# Patient Record
Sex: Male | Born: 1950 | Race: White | Hispanic: No | Marital: Married | Smoking: Never smoker
Health system: Southern US, Community
[De-identification: ages and names within clinical notes are randomized; demographics above are authoritative.]

## PROBLEM LIST (undated history)

## (undated) DIAGNOSIS — I452 Bifascicular block: Secondary | ICD-10-CM

## (undated) DIAGNOSIS — G4730 Sleep apnea, unspecified: Secondary | ICD-10-CM

## (undated) DIAGNOSIS — F329 Major depressive disorder, single episode, unspecified: Secondary | ICD-10-CM

## (undated) DIAGNOSIS — I10 Essential (primary) hypertension: Secondary | ICD-10-CM

## (undated) DIAGNOSIS — F32A Depression, unspecified: Secondary | ICD-10-CM

## (undated) DIAGNOSIS — G894 Chronic pain syndrome: Secondary | ICD-10-CM

## (undated) DIAGNOSIS — E119 Type 2 diabetes mellitus without complications: Secondary | ICD-10-CM

## (undated) DIAGNOSIS — E785 Hyperlipidemia, unspecified: Secondary | ICD-10-CM

## (undated) DIAGNOSIS — R161 Splenomegaly, not elsewhere classified: Secondary | ICD-10-CM

## (undated) DIAGNOSIS — M549 Dorsalgia, unspecified: Secondary | ICD-10-CM

## (undated) HISTORY — DX: Depression, unspecified: F32.A

## (undated) HISTORY — DX: Chronic pain syndrome: G89.4

## (undated) HISTORY — DX: Type 2 diabetes mellitus without complications: E11.9

## (undated) HISTORY — DX: Essential (primary) hypertension: I10

## (undated) HISTORY — DX: Hyperlipidemia, unspecified: E78.5

## (undated) HISTORY — DX: Dorsalgia, unspecified: M54.9

## (undated) HISTORY — DX: Sleep apnea, unspecified: G47.30

## (undated) HISTORY — DX: Bifascicular block: I45.2

## (undated) HISTORY — DX: Splenomegaly, not elsewhere classified: R16.1

## (undated) HISTORY — DX: Major depressive disorder, single episode, unspecified: F32.9

---

## 1975-06-08 HISTORY — PX: INGUINAL HERNIA REPAIR: SHX194

## 1989-06-07 HISTORY — PX: APPENDECTOMY: SHX54

## 1999-06-08 HISTORY — PX: LUMBAR LAMINECTOMY: SHX95

## 1999-11-20 ENCOUNTER — Inpatient Hospital Stay (HOSPITAL_COMMUNITY): Admission: RE | Admit: 1999-11-20 | Discharge: 1999-11-21 | Payer: Self-pay | Admitting: Neurosurgery

## 2000-02-21 ENCOUNTER — Ambulatory Visit (HOSPITAL_COMMUNITY): Admission: RE | Admit: 2000-02-21 | Discharge: 2000-02-21 | Payer: Self-pay | Admitting: Internal Medicine

## 2000-03-11 ENCOUNTER — Encounter: Admission: RE | Admit: 2000-03-11 | Discharge: 2000-04-11 | Payer: Self-pay | Admitting: Neurosurgery

## 2000-04-25 ENCOUNTER — Inpatient Hospital Stay (HOSPITAL_COMMUNITY): Admission: EM | Admit: 2000-04-25 | Discharge: 2000-04-26 | Payer: Self-pay | Admitting: Emergency Medicine

## 2000-04-25 ENCOUNTER — Encounter: Payer: Self-pay | Admitting: *Deleted

## 2000-04-26 ENCOUNTER — Encounter: Payer: Self-pay | Admitting: Cardiovascular Disease

## 2000-10-01 ENCOUNTER — Ambulatory Visit (HOSPITAL_COMMUNITY): Admission: RE | Admit: 2000-10-01 | Discharge: 2000-10-01 | Payer: Self-pay | Admitting: Neurosurgery

## 2001-06-07 DIAGNOSIS — R161 Splenomegaly, not elsewhere classified: Secondary | ICD-10-CM

## 2001-06-07 HISTORY — DX: Splenomegaly, not elsewhere classified: R16.1

## 2002-01-18 ENCOUNTER — Encounter: Payer: Self-pay | Admitting: Internal Medicine

## 2002-01-24 ENCOUNTER — Encounter: Payer: Self-pay | Admitting: Internal Medicine

## 2002-01-25 ENCOUNTER — Encounter: Payer: Self-pay | Admitting: Internal Medicine

## 2003-09-26 ENCOUNTER — Encounter: Payer: Self-pay | Admitting: Internal Medicine

## 2004-02-03 ENCOUNTER — Encounter
Admission: RE | Admit: 2004-02-03 | Discharge: 2004-03-25 | Payer: Self-pay | Admitting: Physical Medicine and Rehabilitation

## 2004-02-28 ENCOUNTER — Ambulatory Visit: Payer: Self-pay | Admitting: Physical Medicine and Rehabilitation

## 2004-03-13 ENCOUNTER — Encounter
Admission: RE | Admit: 2004-03-13 | Discharge: 2004-04-14 | Payer: Self-pay | Admitting: Physical Medicine and Rehabilitation

## 2004-03-25 ENCOUNTER — Encounter
Admission: RE | Admit: 2004-03-25 | Discharge: 2004-05-14 | Payer: Self-pay | Admitting: Physical Medicine and Rehabilitation

## 2004-04-24 ENCOUNTER — Ambulatory Visit: Payer: Self-pay | Admitting: Physical Medicine and Rehabilitation

## 2004-05-14 ENCOUNTER — Encounter
Admission: RE | Admit: 2004-05-14 | Discharge: 2004-08-12 | Payer: Self-pay | Admitting: Physical Medicine and Rehabilitation

## 2004-05-20 ENCOUNTER — Encounter
Admission: RE | Admit: 2004-05-20 | Discharge: 2004-08-18 | Payer: Self-pay | Admitting: Physical Medicine and Rehabilitation

## 2004-06-24 ENCOUNTER — Ambulatory Visit: Payer: Self-pay | Admitting: Physical Medicine and Rehabilitation

## 2004-08-10 ENCOUNTER — Encounter: Admission: RE | Admit: 2004-08-10 | Discharge: 2004-08-10 | Payer: Self-pay | Admitting: Family Medicine

## 2004-08-24 ENCOUNTER — Encounter
Admission: RE | Admit: 2004-08-24 | Discharge: 2004-11-22 | Payer: Self-pay | Admitting: Physical Medicine and Rehabilitation

## 2004-08-26 ENCOUNTER — Ambulatory Visit: Payer: Self-pay | Admitting: Physical Medicine and Rehabilitation

## 2004-09-14 ENCOUNTER — Ambulatory Visit: Payer: Self-pay | Admitting: Internal Medicine

## 2004-10-21 ENCOUNTER — Ambulatory Visit: Payer: Self-pay | Admitting: Physical Medicine and Rehabilitation

## 2004-11-19 ENCOUNTER — Encounter
Admission: RE | Admit: 2004-11-19 | Discharge: 2005-02-17 | Payer: Self-pay | Admitting: Physical Medicine and Rehabilitation

## 2004-12-16 ENCOUNTER — Ambulatory Visit: Payer: Self-pay | Admitting: Physical Medicine and Rehabilitation

## 2004-12-24 ENCOUNTER — Ambulatory Visit: Payer: Self-pay | Admitting: Internal Medicine

## 2005-02-10 ENCOUNTER — Ambulatory Visit: Payer: Self-pay | Admitting: Physical Medicine and Rehabilitation

## 2005-03-09 ENCOUNTER — Encounter
Admission: RE | Admit: 2005-03-09 | Discharge: 2005-06-07 | Payer: Self-pay | Admitting: Physical Medicine and Rehabilitation

## 2005-04-06 ENCOUNTER — Ambulatory Visit: Payer: Self-pay | Admitting: Physical Medicine and Rehabilitation

## 2005-05-28 ENCOUNTER — Ambulatory Visit: Payer: Self-pay | Admitting: Physical Medicine and Rehabilitation

## 2005-06-29 ENCOUNTER — Ambulatory Visit: Payer: Self-pay | Admitting: Physical Medicine and Rehabilitation

## 2005-06-29 ENCOUNTER — Encounter
Admission: RE | Admit: 2005-06-29 | Discharge: 2005-09-27 | Payer: Self-pay | Admitting: Physical Medicine and Rehabilitation

## 2005-07-27 ENCOUNTER — Ambulatory Visit: Payer: Self-pay | Admitting: Physical Medicine and Rehabilitation

## 2005-09-23 ENCOUNTER — Ambulatory Visit: Payer: Self-pay | Admitting: Physical Medicine and Rehabilitation

## 2005-10-21 ENCOUNTER — Encounter
Admission: RE | Admit: 2005-10-21 | Discharge: 2006-01-19 | Payer: Self-pay | Admitting: Physical Medicine and Rehabilitation

## 2005-10-21 ENCOUNTER — Ambulatory Visit: Payer: Self-pay | Admitting: Physical Medicine and Rehabilitation

## 2005-11-18 ENCOUNTER — Ambulatory Visit: Payer: Self-pay | Admitting: Physical Medicine and Rehabilitation

## 2006-01-13 ENCOUNTER — Ambulatory Visit: Payer: Self-pay | Admitting: Physical Medicine and Rehabilitation

## 2006-02-14 ENCOUNTER — Encounter
Admission: RE | Admit: 2006-02-14 | Discharge: 2006-05-15 | Payer: Self-pay | Admitting: Physical Medicine and Rehabilitation

## 2006-02-14 ENCOUNTER — Ambulatory Visit: Payer: Self-pay | Admitting: Physical Medicine and Rehabilitation

## 2006-02-23 ENCOUNTER — Ambulatory Visit: Payer: Self-pay | Admitting: Family Medicine

## 2006-03-07 LAB — HM COLONOSCOPY

## 2006-03-14 ENCOUNTER — Ambulatory Visit: Payer: Self-pay | Admitting: Physical Medicine and Rehabilitation

## 2006-03-16 ENCOUNTER — Ambulatory Visit: Payer: Self-pay | Admitting: Family Medicine

## 2006-05-11 ENCOUNTER — Ambulatory Visit: Payer: Self-pay | Admitting: Physical Medicine and Rehabilitation

## 2006-06-03 ENCOUNTER — Ambulatory Visit: Payer: Self-pay | Admitting: Family Medicine

## 2006-06-08 ENCOUNTER — Encounter
Admission: RE | Admit: 2006-06-08 | Discharge: 2006-09-06 | Payer: Self-pay | Admitting: Physical Medicine and Rehabilitation

## 2006-06-28 ENCOUNTER — Ambulatory Visit: Payer: Self-pay | Admitting: Family Medicine

## 2006-06-28 LAB — CONVERTED CEMR LAB
ALT: 55 units/L — ABNORMAL HIGH (ref 0–40)
Calcium: 9.3 mg/dL (ref 8.4–10.5)
Cholesterol: 183 mg/dL (ref 0–200)
GFR calc non Af Amer: 74 mL/min
Glucose, Bld: 117 mg/dL — ABNORMAL HIGH (ref 70–99)
Potassium: 4.3 meq/L (ref 3.5–5.1)
Sodium: 140 meq/L (ref 135–145)
Total CHOL/HDL Ratio: 3.9
VLDL: 31 mg/dL (ref 0–40)

## 2006-07-05 ENCOUNTER — Ambulatory Visit: Payer: Self-pay | Admitting: Physical Medicine and Rehabilitation

## 2006-07-24 DIAGNOSIS — I10 Essential (primary) hypertension: Secondary | ICD-10-CM

## 2006-07-24 DIAGNOSIS — E785 Hyperlipidemia, unspecified: Secondary | ICD-10-CM

## 2006-07-24 DIAGNOSIS — F329 Major depressive disorder, single episode, unspecified: Secondary | ICD-10-CM

## 2006-07-29 ENCOUNTER — Ambulatory Visit: Payer: Self-pay | Admitting: Family Medicine

## 2006-08-02 ENCOUNTER — Ambulatory Visit: Payer: Self-pay | Admitting: Family Medicine

## 2006-08-02 LAB — CONVERTED CEMR LAB
ALT: 56 units/L — ABNORMAL HIGH (ref 0–40)
AST: 39 units/L — ABNORMAL HIGH (ref 0–37)
BUN: 22 mg/dL (ref 6–23)
CO2: 26 meq/L (ref 19–32)
Potassium: 3.5 meq/L (ref 3.5–5.1)

## 2006-08-30 ENCOUNTER — Ambulatory Visit: Payer: Self-pay | Admitting: Physical Medicine and Rehabilitation

## 2006-09-27 ENCOUNTER — Encounter
Admission: RE | Admit: 2006-09-27 | Discharge: 2006-12-26 | Payer: Self-pay | Admitting: Physical Medicine and Rehabilitation

## 2006-10-21 ENCOUNTER — Ambulatory Visit: Payer: Self-pay | Admitting: Physical Medicine and Rehabilitation

## 2006-10-25 DIAGNOSIS — M549 Dorsalgia, unspecified: Secondary | ICD-10-CM

## 2006-10-25 DIAGNOSIS — G8929 Other chronic pain: Secondary | ICD-10-CM

## 2006-10-25 DIAGNOSIS — G4730 Sleep apnea, unspecified: Secondary | ICD-10-CM | POA: Insufficient documentation

## 2006-11-02 ENCOUNTER — Ambulatory Visit: Payer: Self-pay | Admitting: Family Medicine

## 2006-11-30 ENCOUNTER — Ambulatory Visit: Payer: Self-pay | Admitting: Family Medicine

## 2006-12-04 LAB — CONVERTED CEMR LAB
AST: 37 units/L (ref 0–37)
CO2: 29 meq/L (ref 19–32)
Calcium: 9.3 mg/dL (ref 8.4–10.5)
Chloride: 103 meq/L (ref 96–112)
Cholesterol: 184 mg/dL (ref 0–200)
GFR calc Af Amer: 89 mL/min
GFR calc non Af Amer: 74 mL/min
Glucose, Bld: 128 mg/dL — ABNORMAL HIGH (ref 70–99)
HDL: 45.6 mg/dL (ref >39.0)
Potassium: 4.4 meq/L (ref 3.5–5.1)
Total CHOL/HDL Ratio: 4
Triglycerides: 130 mg/dL (ref 0–149)
VLDL: 26 mg/dL (ref 0–40)

## 2006-12-05 ENCOUNTER — Telehealth (INDEPENDENT_AMBULATORY_CARE_PROVIDER_SITE_OTHER): Payer: Self-pay | Admitting: *Deleted

## 2006-12-16 ENCOUNTER — Telehealth (INDEPENDENT_AMBULATORY_CARE_PROVIDER_SITE_OTHER): Payer: Self-pay | Admitting: *Deleted

## 2006-12-16 ENCOUNTER — Ambulatory Visit: Payer: Self-pay | Admitting: Family Medicine

## 2006-12-16 DIAGNOSIS — E119 Type 2 diabetes mellitus without complications: Secondary | ICD-10-CM

## 2006-12-16 LAB — CONVERTED CEMR LAB: Microalb Creat Ratio: 2.5 mg/g (ref 0.0–30.0)

## 2006-12-19 ENCOUNTER — Ambulatory Visit: Payer: Self-pay | Admitting: Physical Medicine and Rehabilitation

## 2006-12-21 ENCOUNTER — Telehealth (INDEPENDENT_AMBULATORY_CARE_PROVIDER_SITE_OTHER): Payer: Self-pay | Admitting: *Deleted

## 2007-01-02 ENCOUNTER — Encounter: Admission: RE | Admit: 2007-01-02 | Discharge: 2007-03-05 | Payer: Self-pay | Admitting: Family Medicine

## 2007-01-02 ENCOUNTER — Encounter (INDEPENDENT_AMBULATORY_CARE_PROVIDER_SITE_OTHER): Payer: Self-pay | Admitting: Family Medicine

## 2007-01-06 ENCOUNTER — Telehealth (INDEPENDENT_AMBULATORY_CARE_PROVIDER_SITE_OTHER): Payer: Self-pay | Admitting: *Deleted

## 2007-01-10 ENCOUNTER — Telehealth (INDEPENDENT_AMBULATORY_CARE_PROVIDER_SITE_OTHER): Payer: Self-pay | Admitting: *Deleted

## 2007-01-16 ENCOUNTER — Ambulatory Visit: Payer: Self-pay | Admitting: Family Medicine

## 2007-01-18 ENCOUNTER — Encounter
Admission: RE | Admit: 2007-01-18 | Discharge: 2007-04-18 | Payer: Self-pay | Admitting: Physical Medicine and Rehabilitation

## 2007-01-19 ENCOUNTER — Encounter (INDEPENDENT_AMBULATORY_CARE_PROVIDER_SITE_OTHER): Payer: Self-pay | Admitting: *Deleted

## 2007-01-19 LAB — CONVERTED CEMR LAB
ALT: 55 units/L — ABNORMAL HIGH (ref 0–53)
Cholesterol: 167 mg/dL (ref 0–200)
HDL: 43.4 mg/dL (ref >39.0)
Triglycerides: 95 mg/dL (ref 0–149)
VLDL: 19 mg/dL (ref 0–40)

## 2007-02-09 ENCOUNTER — Telehealth (INDEPENDENT_AMBULATORY_CARE_PROVIDER_SITE_OTHER): Payer: Self-pay | Admitting: *Deleted

## 2007-02-14 ENCOUNTER — Ambulatory Visit: Payer: Self-pay | Admitting: Physical Medicine and Rehabilitation

## 2007-03-02 ENCOUNTER — Ambulatory Visit: Payer: Self-pay | Admitting: Family Medicine

## 2007-03-02 ENCOUNTER — Telehealth (INDEPENDENT_AMBULATORY_CARE_PROVIDER_SITE_OTHER): Payer: Self-pay | Admitting: *Deleted

## 2007-03-02 LAB — CONVERTED CEMR LAB
AST: 32 units/L (ref 0–37)
BUN: 18 mg/dL (ref 6–23)
Calcium: 9.8 mg/dL (ref 8.4–10.5)
Chloride: 109 meq/L (ref 96–112)
Cholesterol: 187 mg/dL (ref 0–200)
Creatinine, Ser: 1 mg/dL (ref 0.4–1.5)
Creatinine,U: 174.5 mg/dL
HDL: 45.8 mg/dL (ref >39.0)
Microalb, Ur: 0.9 mg/dL (ref 0.0–1.9)
Potassium: 4.6 meq/L (ref 3.5–5.1)
Total CHOL/HDL Ratio: 4.1

## 2007-04-05 ENCOUNTER — Ambulatory Visit: Payer: Self-pay | Admitting: Physical Medicine and Rehabilitation

## 2007-04-19 ENCOUNTER — Encounter (INDEPENDENT_AMBULATORY_CARE_PROVIDER_SITE_OTHER): Payer: Self-pay | Admitting: Family Medicine

## 2007-05-09 ENCOUNTER — Ambulatory Visit: Payer: Self-pay | Admitting: Physical Medicine and Rehabilitation

## 2007-05-09 ENCOUNTER — Encounter
Admission: RE | Admit: 2007-05-09 | Discharge: 2007-08-07 | Payer: Self-pay | Admitting: Physical Medicine and Rehabilitation

## 2007-05-19 ENCOUNTER — Ambulatory Visit: Payer: Self-pay | Admitting: Family Medicine

## 2007-05-20 ENCOUNTER — Telehealth (INDEPENDENT_AMBULATORY_CARE_PROVIDER_SITE_OTHER): Payer: Self-pay | Admitting: *Deleted

## 2007-05-20 ENCOUNTER — Encounter (INDEPENDENT_AMBULATORY_CARE_PROVIDER_SITE_OTHER): Payer: Self-pay | Admitting: *Deleted

## 2007-05-20 LAB — CONVERTED CEMR LAB
AST: 39 units/L — ABNORMAL HIGH (ref 0–37)
BUN: 15 mg/dL (ref 6–23)
Chloride: 105 meq/L (ref 96–112)
GFR calc Af Amer: 99 mL/min
GFR calc non Af Amer: 82 mL/min
Hgb A1c MFr Bld: 5.6 % (ref 4.6–6.0)
PSA: 0.65 ng/mL (ref 0.10–4.00)
Potassium: 3.5 meq/L (ref 3.5–5.1)
Total CHOL/HDL Ratio: 3.6
Triglycerides: 173 mg/dL — ABNORMAL HIGH (ref 0–149)
VLDL: 35 mg/dL (ref 0–40)

## 2007-06-06 ENCOUNTER — Ambulatory Visit: Payer: Self-pay | Admitting: Physical Medicine and Rehabilitation

## 2007-06-08 DIAGNOSIS — I452 Bifascicular block: Secondary | ICD-10-CM

## 2007-06-08 HISTORY — DX: Bifascicular block: I45.2

## 2007-07-04 ENCOUNTER — Encounter
Admission: RE | Admit: 2007-07-04 | Discharge: 2007-10-02 | Payer: Self-pay | Admitting: Physical Medicine and Rehabilitation

## 2007-07-18 ENCOUNTER — Telehealth (INDEPENDENT_AMBULATORY_CARE_PROVIDER_SITE_OTHER): Payer: Self-pay | Admitting: *Deleted

## 2007-08-01 ENCOUNTER — Ambulatory Visit: Payer: Self-pay | Admitting: Physical Medicine and Rehabilitation

## 2007-08-18 ENCOUNTER — Ambulatory Visit: Payer: Self-pay | Admitting: Family Medicine

## 2007-08-20 LAB — CONVERTED CEMR LAB
ALT: 43 units/L (ref 0–53)
AST: 42 units/L — ABNORMAL HIGH (ref 0–37)
Hgb A1c MFr Bld: 5.7 % (ref 4.6–6.0)

## 2007-08-21 ENCOUNTER — Encounter (INDEPENDENT_AMBULATORY_CARE_PROVIDER_SITE_OTHER): Payer: Self-pay | Admitting: *Deleted

## 2007-09-21 ENCOUNTER — Encounter
Admission: RE | Admit: 2007-09-21 | Discharge: 2007-12-20 | Payer: Self-pay | Admitting: Physical Medicine and Rehabilitation

## 2007-09-25 ENCOUNTER — Ambulatory Visit: Payer: Self-pay | Admitting: Physical Medicine and Rehabilitation

## 2007-10-23 ENCOUNTER — Ambulatory Visit: Payer: Self-pay | Admitting: Physical Medicine and Rehabilitation

## 2007-10-31 ENCOUNTER — Ambulatory Visit: Payer: Self-pay | Admitting: Internal Medicine

## 2007-10-31 ENCOUNTER — Telehealth (INDEPENDENT_AMBULATORY_CARE_PROVIDER_SITE_OTHER): Payer: Self-pay | Admitting: Internal Medicine

## 2007-11-01 ENCOUNTER — Telehealth (INDEPENDENT_AMBULATORY_CARE_PROVIDER_SITE_OTHER): Payer: Self-pay | Admitting: *Deleted

## 2007-11-13 ENCOUNTER — Encounter: Payer: Self-pay | Admitting: Family Medicine

## 2007-11-13 ENCOUNTER — Ambulatory Visit: Payer: Self-pay | Admitting: Internal Medicine

## 2007-11-15 ENCOUNTER — Telehealth (INDEPENDENT_AMBULATORY_CARE_PROVIDER_SITE_OTHER): Payer: Self-pay | Admitting: *Deleted

## 2007-11-16 ENCOUNTER — Telehealth: Payer: Self-pay | Admitting: Internal Medicine

## 2007-11-20 ENCOUNTER — Ambulatory Visit: Payer: Self-pay | Admitting: Physical Medicine and Rehabilitation

## 2007-11-21 ENCOUNTER — Ambulatory Visit: Payer: Self-pay | Admitting: Internal Medicine

## 2007-11-21 DIAGNOSIS — R945 Abnormal results of liver function studies: Secondary | ICD-10-CM

## 2007-11-27 LAB — CONVERTED CEMR LAB
Albumin: 4 g/dL (ref 3.5–5.2)
Basophils Absolute: 0 10*3/uL (ref 0.0–0.1)
Bilirubin, Direct: 0.1 mg/dL (ref 0.0–0.3)
CO2: 30 meq/L (ref 19–32)
Calcium: 9 mg/dL (ref 8.4–10.5)
Creatinine, Ser: 1.1 mg/dL (ref 0.4–1.5)
Eosinophils Absolute: 0.1 10*3/uL (ref 0.0–0.7)
Eosinophils Relative: 2.1 % (ref 0.0–5.0)
GFR calc non Af Amer: 74 mL/min
HCT: 43.4 % (ref 39.0–52.0)
Hemoglobin: 15 g/dL (ref 13.0–17.0)
MCV: 91.5 fL (ref 78.0–100.0)
Monocytes Absolute: 0.4 10*3/uL (ref 0.1–1.0)
Monocytes Relative: 8.8 % (ref 3.0–12.0)
Neutro Abs: 3 10*3/uL (ref 1.4–7.7)
Platelets: 125 10*3/uL — ABNORMAL LOW (ref 150–400)
Potassium: 4.1 meq/L (ref 3.5–5.1)
RBC: 4.74 M/uL (ref 4.22–5.81)
RDW: 11.9 % (ref 11.5–14.6)
Sodium: 142 meq/L (ref 135–145)
Total Protein: 6.4 g/dL (ref 6.0–8.3)
WBC: 4.6 10*3/uL (ref 4.5–10.5)

## 2007-12-06 ENCOUNTER — Ambulatory Visit: Payer: Self-pay | Admitting: Cardiology

## 2007-12-19 ENCOUNTER — Encounter
Admission: RE | Admit: 2007-12-19 | Discharge: 2008-01-26 | Payer: Self-pay | Admitting: Physical Medicine and Rehabilitation

## 2007-12-20 ENCOUNTER — Ambulatory Visit: Payer: Self-pay | Admitting: Physical Medicine and Rehabilitation

## 2007-12-29 ENCOUNTER — Encounter: Payer: Self-pay | Admitting: Cardiology

## 2007-12-29 ENCOUNTER — Ambulatory Visit: Payer: Self-pay

## 2008-01-26 ENCOUNTER — Ambulatory Visit: Payer: Self-pay | Admitting: Physical Medicine and Rehabilitation

## 2008-03-13 ENCOUNTER — Ambulatory Visit: Payer: Self-pay | Admitting: Internal Medicine

## 2008-03-13 DIAGNOSIS — R42 Dizziness and giddiness: Secondary | ICD-10-CM

## 2008-03-22 ENCOUNTER — Encounter
Admission: RE | Admit: 2008-03-22 | Discharge: 2008-06-20 | Payer: Self-pay | Admitting: Physical Medicine and Rehabilitation

## 2008-03-25 ENCOUNTER — Ambulatory Visit: Payer: Self-pay | Admitting: Physical Medicine and Rehabilitation

## 2008-04-22 ENCOUNTER — Encounter: Payer: Self-pay | Admitting: Internal Medicine

## 2008-06-03 ENCOUNTER — Ambulatory Visit: Payer: Self-pay | Admitting: Family Medicine

## 2008-06-03 DIAGNOSIS — M25519 Pain in unspecified shoulder: Secondary | ICD-10-CM | POA: Insufficient documentation

## 2008-06-06 ENCOUNTER — Telehealth (INDEPENDENT_AMBULATORY_CARE_PROVIDER_SITE_OTHER): Payer: Self-pay | Admitting: *Deleted

## 2008-06-11 ENCOUNTER — Telehealth (INDEPENDENT_AMBULATORY_CARE_PROVIDER_SITE_OTHER): Payer: Self-pay | Admitting: *Deleted

## 2008-07-08 HISTORY — PX: SHOULDER SURGERY: SHX246

## 2008-07-18 ENCOUNTER — Ambulatory Visit (HOSPITAL_BASED_OUTPATIENT_CLINIC_OR_DEPARTMENT_OTHER): Admission: RE | Admit: 2008-07-18 | Discharge: 2008-07-19 | Payer: Self-pay | Admitting: Specialist

## 2008-08-06 ENCOUNTER — Encounter
Admission: RE | Admit: 2008-08-06 | Discharge: 2008-11-04 | Payer: Self-pay | Admitting: Physical Medicine and Rehabilitation

## 2008-08-07 ENCOUNTER — Ambulatory Visit: Payer: Self-pay | Admitting: Physical Medicine and Rehabilitation

## 2008-09-28 ENCOUNTER — Emergency Department (HOSPITAL_BASED_OUTPATIENT_CLINIC_OR_DEPARTMENT_OTHER): Admission: EM | Admit: 2008-09-28 | Discharge: 2008-09-28 | Payer: Self-pay | Admitting: Emergency Medicine

## 2008-09-28 ENCOUNTER — Ambulatory Visit: Payer: Self-pay | Admitting: Diagnostic Radiology

## 2008-10-02 ENCOUNTER — Ambulatory Visit: Payer: Self-pay | Admitting: Physical Medicine and Rehabilitation

## 2008-10-21 ENCOUNTER — Ambulatory Visit: Payer: Self-pay | Admitting: Internal Medicine

## 2008-10-28 ENCOUNTER — Ambulatory Visit: Payer: Self-pay | Admitting: Internal Medicine

## 2008-10-30 LAB — CONVERTED CEMR LAB
Albumin: 4.1 g/dL (ref 3.5–5.2)
BUN: 20 mg/dL (ref 6–23)
Basophils Absolute: 0 10*3/uL (ref 0.0–0.1)
Bilirubin, Direct: 0.1 mg/dL (ref 0.0–0.3)
CO2: 29 meq/L (ref 19–32)
Calcium: 9.5 mg/dL (ref 8.4–10.5)
Cholesterol: 178 mg/dL (ref 0–200)
Creatinine, Ser: 1 mg/dL (ref 0.4–1.5)
Creatinine,U: 154.9 mg/dL
GFR calc non Af Amer: 81.69 mL/min (ref >60)
Glucose, Bld: 127 mg/dL — ABNORMAL HIGH (ref 70–99)
HDL: 47.5 mg/dL (ref >39.00)
Hemoglobin: 16.9 g/dL (ref 13.0–17.0)
LDL Cholesterol: 103 mg/dL — ABNORMAL HIGH (ref 0–99)
MCHC: 35.1 g/dL (ref 30.0–36.0)
MCV: 91.9 fL (ref 78.0–100.0)
Microalb Creat Ratio: 1.9 mg/g (ref 0.0–30.0)
Monocytes Absolute: 0.5 10*3/uL (ref 0.1–1.0)
Monocytes Relative: 9.4 % (ref 3.0–12.0)
Platelets: 111 10*3/uL — ABNORMAL LOW (ref 150.0–400.0)
Total CHOL/HDL Ratio: 4
Total Protein: 6.9 g/dL (ref 6.0–8.3)
VLDL: 27.4 mg/dL (ref 0.0–40.0)
WBC: 4.8 10*3/uL (ref 4.5–10.5)

## 2008-11-01 ENCOUNTER — Ambulatory Visit: Payer: Self-pay | Admitting: Physical Medicine and Rehabilitation

## 2008-12-12 ENCOUNTER — Encounter
Admission: RE | Admit: 2008-12-12 | Discharge: 2009-03-12 | Payer: Self-pay | Admitting: Physical Medicine and Rehabilitation

## 2008-12-16 ENCOUNTER — Ambulatory Visit: Payer: Self-pay | Admitting: Physical Medicine and Rehabilitation

## 2009-01-10 ENCOUNTER — Ambulatory Visit: Payer: Self-pay | Admitting: Physical Medicine and Rehabilitation

## 2009-03-06 ENCOUNTER — Encounter
Admission: RE | Admit: 2009-03-06 | Discharge: 2009-06-03 | Payer: Self-pay | Admitting: Physical Medicine & Rehabilitation

## 2009-04-07 ENCOUNTER — Ambulatory Visit: Payer: Self-pay | Admitting: Physical Medicine and Rehabilitation

## 2009-06-16 ENCOUNTER — Ambulatory Visit: Payer: Self-pay | Admitting: Internal Medicine

## 2009-06-18 LAB — CONVERTED CEMR LAB
BUN: 21 mg/dL (ref 6–23)
CO2: 26 meq/L (ref 19–32)
Chloride: 106 meq/L (ref 96–112)
Creatinine, Ser: 1 mg/dL (ref 0.4–1.5)
GFR calc non Af Amer: 81.5 mL/min (ref >60)
Glucose, Bld: 98 mg/dL (ref 70–99)
Hgb A1c MFr Bld: 6 % (ref 4.6–6.5)
Potassium: 3.9 meq/L (ref 3.5–5.1)

## 2009-10-07 ENCOUNTER — Telehealth (INDEPENDENT_AMBULATORY_CARE_PROVIDER_SITE_OTHER): Payer: Self-pay | Admitting: *Deleted

## 2010-04-13 ENCOUNTER — Telehealth (INDEPENDENT_AMBULATORY_CARE_PROVIDER_SITE_OTHER): Payer: Self-pay | Admitting: *Deleted

## 2010-04-27 ENCOUNTER — Ambulatory Visit: Payer: Self-pay | Admitting: Internal Medicine

## 2010-05-08 ENCOUNTER — Ambulatory Visit: Payer: Self-pay | Admitting: Internal Medicine

## 2010-05-11 ENCOUNTER — Ambulatory Visit: Payer: Self-pay | Admitting: Family Medicine

## 2010-05-13 LAB — CONVERTED CEMR LAB
AST: 30 units/L (ref 0–37)
BUN: 16 mg/dL (ref 6–23)
Basophils Absolute: 0 10*3/uL (ref 0.0–0.1)
Basophils Relative: 0.5 % (ref 0.0–3.0)
CO2: 24 meq/L (ref 19–32)
Cholesterol: 188 mg/dL (ref 0–200)
Eosinophils Relative: 2.2 % (ref 0.0–5.0)
GFR calc non Af Amer: 78.53 mL/min (ref >60)
HDL: 47.4 mg/dL (ref >39.00)
Hgb A1c MFr Bld: 5.9 % (ref 4.6–6.5)
Monocytes Absolute: 0.4 10*3/uL (ref 0.1–1.0)
Neutro Abs: 3.1 10*3/uL (ref 1.4–7.7)
RBC: 5.11 M/uL (ref 4.22–5.81)
RDW: 13.1 % (ref 11.5–14.6)

## 2010-05-27 ENCOUNTER — Telehealth: Payer: Self-pay | Admitting: Internal Medicine

## 2010-05-27 ENCOUNTER — Telehealth (INDEPENDENT_AMBULATORY_CARE_PROVIDER_SITE_OTHER): Payer: Self-pay | Admitting: *Deleted

## 2010-06-22 ENCOUNTER — Telehealth: Payer: Self-pay | Admitting: Internal Medicine

## 2010-06-25 ENCOUNTER — Ambulatory Visit
Admission: RE | Admit: 2010-06-25 | Discharge: 2010-06-25 | Payer: Self-pay | Source: Home / Self Care | Attending: Internal Medicine | Admitting: Internal Medicine

## 2010-07-09 ENCOUNTER — Telehealth: Payer: Self-pay | Admitting: Internal Medicine

## 2010-07-09 NOTE — Assessment & Plan Note (Signed)
Summary: cpx///sph   Vital Signs:  Patient profile:   60 year old male Height:      72 inches Weight:      253.50 pounds BMI:     34.51 Pulse rate:   100 / minute Pulse rhythm:   regular BP sitting:   146 / 88  (left arm) Cuff size:   large  Vitals Entered By: Army Fossa CMA (April 27, 2010 3:01 PM) CC: CPX, not fasting  Comments [facility] discuss flu shot    History of Present Illness: CPX   Current Medications (verified): 1)  Zocor 40 Mg  Tabs (Simvastatin) .Marland Kitchen.. 1 By Mouth Once Daily 2)  Benicar Hct 40-25 Mg  Tabs (Olmesartan Medoxomil-Hctz) .... Take One Tablet Daily 3)  Effexor 25 Mg  Tabs (Venlafaxine Hcl) .Marland Kitchen.. 1 By Mouth Two Times A Day 4)  Free Style Lite Test Strips .... Use As Directed Twice Daily  Allergies (verified): 1)  ! Sulfa  Past History:  Past Medical History: DIABETES MELLITUS, TYPE II ---borderline  SLEEP APNEA--couldn't tol CPAP (has a machine) HYPERTENSION  HYPERLIPIDEMIA  DEPRESSION 2003-- was Dx w/  splenomegaly and mild ITP RBBB Dx 2009, neg stress ECHO, saw cards Occ LBP even after surgery in the back 2001  Past Surgical History: Reviewed history from 10/21/2008 and no changes required. Inguinal herniorrhaphy (4332) Appendectomy (9518) Lumbar laminectomy (2001) Dr Lovell Sheehan, still has mild pain R shoulder surgery 07-2008  Family History: Reviewed history from 10/21/2008 and no changes required. 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 CV:  Denies chest pain or discomfort, palpitations, and swelling of feet. Resp:  Denies cough  and shortness of breath. GI:  Denies bloody stools, diarrhea, and nausea. Psych:  Denies anxiety and depression.  Physical Exam  General:  alert, well-developed, and overweight-appearing.   Neck:  no masses and no thyromegaly.   Lungs:  normal respiratory effort, no intercostal retractions, no accessory muscle use, and normal breath sounds.   Heart:  normal rate, regular rhythm, and no murmur.   Abdomen:  soft, non-tender, no distention, no masses, no guarding, and no rigidity.   Rectal:  external  skin tag  noted. Normal sphincter tone. No rectal masses or tenderness. Prostate:  Prostate gland firm and smooth, no enlargement, nodularity, tenderness, mass, asymmetry or induration. Extremities:  no lower extremity edema Neurologic:  alert & oriented X3, strength normal in all extremities, and gait normal.   Psych:  Oriented X3, memory intact for recent and remote, normally interactive, good eye contact, and not depressed appearing.   slightly anxious   Impression & Recommendations:  Problem # 1:  PREVENTIVE HEALTH CARE (ICD-V70.0) Td 2005 flu shot today   3th Cscope  per pt was 2010, next in 5 years   (Dr Dhraelos @ [facility]) labs  long discussion about diet-exercise    Problem # 2:  DIABETES MELLITUS, TYPE II (ICD-250.00) labs  His updated medication list for this problem includes:    Benicar Hct 40-25 Mg Tabs (Olmesartan medoxomil-hctz) .Marland Kitchen... Take one tablet daily  Labs Reviewed: Creat:  1.0 (06/16/2009)    Reviewed HgBA1c results: 6.0 (06/16/2009)  5.6 (10/28/2008)  Problem # 3:  HYPERTENSION (ICD-401.9) labs. see instructions  His updated medication list for this problem includes:    Benicar Hct 40-25 Mg Tabs (Olmesartan medoxomil-hctz) .Marland Kitchen... Take one tablet daily  BP today: 146/88 Prior BP: 120/80 (06/16/2009)  Labs Reviewed: K+: 3.9 (06/16/2009) Creat: : 1.0 (06/16/2009)   Chol: 178 (10/28/2008)   HDL: 47.50 (10/28/2008)   LDL: 103 (10/28/2008)   TG: 137.0  (10/28/2008)  Complete Medication List: 1)  Zocor 40 Mg Tabs (Simvastatin) .Marland Kitchen.. 1 by mouth once daily 2)  Benicar Hct 40-25 Mg Tabs (Olmesartan medoxomil-hctz) .... Take one tablet daily 3)  Effexor 25 Mg Tabs (Venlafaxine hcl) .Marland Kitchen.. 1 by mouth two times a day 4)  Free Style Lite Test Strips  .... Use as directed twice daily  Other Orders: Admin 1st Vaccine (16109) Flu Vaccine 52yrs + (60454)  Patient Instructions: 1)  please come back fasting 2)  CBC, BMP,  AST,  ALT, FLP, PSA--- v70 3)  Hemoglobin A1c, microalbumin--- dx diabetes  4)  Check your blood pressure 2 or 3 times a week. If it is more than 140/85 consistently,please let us know  5)  Please schedule a follow-up appointment in 4 months .  Prescriptions: ZOCOR 40 MG  TABS (SIMVASTATIN) 1 by mouth once daily  #90 x 1   Entered by:   Army Fossa CMA   Authorized by:   Nolon Rod. Paz MD   Signed by:   Army Fossa CMA on 04/27/2010   Method used:   Electronically to        [facility]* (mail-order)       [address] [place], [place]  [address]       Ph: [phone]       Fax: [phone]   RxID:   [id number]  Flu Vaccine Consent Questions     Do you have a history of severe allergic reactions to this vaccine? no    Any prior history of allergic reactions to egg and/or gelatin? no    Do you have a sensitivity to the preservative Thimersol? no    Do you have a past history of Guillan-Barre Syndrome? no    Do you currently have an acute febrile illness? no    Have you ever had a severe reaction to latex? no    Vaccine information given and explained to patient? yes    Are you currently pregnant? no    Lot Number:[id number]   Exp Date:12/05/2010   Site Given  Left Deltoid IM       [address]       [place], [place]  [address]       Ph: [phone]       Fax: [phone]   RxID:   [id number]  .[email or web address]   Orders Added: 1)  Admin 1st Vaccine [90471] 2)  Flu Vaccine 75yrs + [90658] 3)   Est. Patient age 28-64 [phone]

## 2010-07-09 NOTE — Assessment & Plan Note (Signed)
Summary: f/u on back pain/drb   Vital Signs:  Patient profile:   60 year old male Height:      72 inches (182.88 cm) Weight:      256.25 pounds (116.48 kg) BMI:     34.88 Temp:     97.8 degrees F (36.56 degrees C) oral BP sitting:   116 / 74  (left arm)  Vitals Entered By: Lucious Groves CMA (June 25, 2010 2:41 PM) CC: F/U back pain./kb Is Patient Diabetic? Yes Pain Assessment Patient in pain? yes     Location: bacik Intensity: 2 Type: aching Onset of pain  one weekend ago Comments Patient notes that he did strenuous work over the holidays and hurt himself and did so again one weekend ago.    History of Present Illness: he was seen 05/11/10 with severe exacerbation of back pain Symptoms were severe for 3 days then got  better. He reinjured his back about 10 days ago while working in the yard and later  fixing a toilet in his house. This pain is similar to his previous back exacerbations.  ROS No fevers No bladder or bowel incontinence no rash in the back  Current Medications (verified): 1)  Benicar Hct 40-25 Mg  Tabs (Olmesartan Medoxomil-Hctz) .... Take One Tablet Daily 2)  Effexor 25 Mg  Tabs (Venlafaxine Hcl) .Marland Kitchen.. 1 By Mouth Two Times A Day 3)  Free Style Lite Test Strips .... Use As Directed Twice Daily 4)  Flexeril 10 Mg Tabs (Cyclobenzaprine Hcl) .Marland Kitchen.. 1 By Mouth Three Times A Day As Needed 5)  Vicodin Es 7.5-750 Mg Tabs (Hydrocodone-Acetaminophen) .Marland Kitchen.. 1 By Mouth Every 6 Hours As Needed 6)  Lipitor 20 Mg Tabs (Atorvastatin Calcium) .Marland Kitchen.. 1 By Mouth Qd  Allergies (verified): 1)  ! Sulfa  Past History:  Past Medical History: Reviewed history from 04/27/2010 and no changes required. DIABETES MELLITUS, TYPE II ---borderline  SLEEP APNEA--couldn't tol CPAP (has a machine) HYPERTENSION  HYPERLIPIDEMIA  DEPRESSION 2003-- was Dx w/  splenomegaly and mild ITP RBBB Dx 2009, neg stress ECHO, saw cards Occ LBP even after surgery in the back 2001  Past Surgical  History: Reviewed history from 10/21/2008 and no changes required. Inguinal herniorrhaphy (0454) Appendectomy (0981) Lumbar laminectomy (2001) Dr Lovell Sheehan, still has mild pain R shoulder surgery 07-2008  Social History: Reviewed history from 04/27/2010 and no changes required. married 2 children occupation-- Production designer, theatre/television/film for a local company  Never Smoked ETOH --socially  Drug use-no Regular exercise--not lately,"I know I need to do ut"  diet-- healthy on-off, not consistently   Physical Exam  General:  alert and well-developed.   Msk:  slightly tender at the left lower back Extremities:  no lower extremity edema Neurologic:  alert & oriented X3, strength normal in all extremities, and DTRs symmetrical and normal.  gait and posture is minimally antalgic   Impression & Recommendations:  Problem # 1:  BACK PAIN (ICD-724.5) the patient has a long history of low back  pain with intermittent exacerbation status post back surgery around 10 years ago, later on he was offered more surgery that he declined. at this point, he had 2 exacerbations recently  but he  is now doing better. We explored the option of taking Motrin or naproxen but historically they have not helped  much. the patient likes to do exercise and be active, i strongly recommended him to do his physical therapy exercises every day  in order to strengthen his core muscles and then attempt to  walking the treadmill or doing some other exercises Patient will call if he likes a PT referral. His updated medication list for this problem includes:    Flexeril 10 Mg Tabs (Cyclobenzaprine hcl) .Marland Kitchen... 1 by mouth three times a day as needed    Vicodin Es 7.5-750 Mg Tabs (Hydrocodone-acetaminophen) .Marland Kitchen... 1 by mouth every 6 hours as needed  Complete Medication List: 1)  Benicar Hct 40-25 Mg Tabs (Olmesartan medoxomil-hctz) .... Take one tablet daily 2)  Effexor 25 Mg Tabs (Venlafaxine hcl) .Marland Kitchen.. 1 by mouth two times a day 3)  Free Style  Lite Test Strips  .... Use as directed twice daily 4)  Flexeril 10 Mg Tabs (Cyclobenzaprine hcl) .Marland Kitchen.. 1 by mouth three times a day as needed 5)  Vicodin Es 7.5-750 Mg Tabs (Hydrocodone-acetaminophen) .Marland Kitchen.. 1 by mouth every 6 hours as needed 6)  Lipitor 20 Mg Tabs (Atorvastatin calcium) .Marland Kitchen.. 1 by mouth qd  Patient Instructions: 1)  Please schedule a follow-up for 08-2009   Orders Added: 1)  Est. Patient Level III [66063]

## 2010-07-09 NOTE — Progress Notes (Signed)
Summary: Refill Requests  Phone Note Refill Request Call back at [phone] Message from:  Pharmacy on Oct 07, 2009 8:28 AM  Refills Requested: Medication #1:  ZOCOR 40 MG  TABS 1 by mouth once daily   Dosage confirmed as above?Dosage Confirmed   Supply Requested: 3 months  Medication #2:  BENICAR HCT 40-25 MG  TABS TAKE ONE TABLET DAILY   Dosage confirmed as above?Dosage Confirmed   Supply Requested: 3 months [facility]  Next Appointment Scheduled: none Initial call taken by: Harold Barban,  Oct 07, 2009 8:29 AM    Prescriptions: BENICAR HCT 40-25 MG  TABS (OLMESARTAN MEDOXOMIL-HCTZ) TAKE ONE TABLET DAILY  #90 x 0   Entered by:   [name]   Authorized by:   Nolon Rod. Paz MD   Signed by:   [name] on 10/07/2009   Method used:   Printed then faxed to ...       [facility]       [address] [place], [place]  [address]       Ph: [phone]       Fax: [phone]   RxID:   [id number] ZOCOR 40 MG  TABS (SIMVASTATIN) 1 by mouth once daily  #90 x 0   Entered by:   [name]   Authorized by:   Nolon Rod. Paz MD   Signed by:   [name] on 10/07/2009   Method used:   Printed then faxed to ...       [facility]       [address] [place], [place]  [address]       Ph: [phone]       Fax: [phone]   RxID:   [id number]

## 2010-07-09 NOTE — Assessment & Plan Note (Signed)
Summary: 6 MONTH FOLLOWUP///SPH  Flu Vaccine Consent Questions     Do you have a history of severe allergic reactions to this vaccine? no    Any prior history of allergic reactions to egg and/or gelatin? no    Do you have a sensitivity to the preservative Thimersol? no    Do you have a past history of Guillan-Barre Syndrome? no    Do you currently have an acute febrile illness? no    Have you ever had a severe reaction to latex? no    Vaccine information given and explained to patient? yes    Are you currently pregnant? no    Lot Number:[id number]   Exp Date:12/04/2009   Site Given right deltoid IM Shary Decamp  June 16, 2009 1:06 PM   Vital Signs:  Patient profile:   60 year old male Height:      72 inches Weight:      257.38 pounds Pulse rate:   80 / minute BP sitting:   120 / 80  Vitals Entered By: [name] (June 16, 2009 12:32 PM) CC: 6 MONTH FOLLOWUP, PT SAYS HE DID EAT   History of Present Illness: DIABETES ambulatory CBGs checked rarely, doesn't recall readings not dieting  or exercising as much   HYPERTENSION - ambulatory BPs "ok"  HYPERLIPIDEMIA -- good medication compliance   Allergies: 1)  ! Sulfa  Past History:  Past Medical History: DIABETES MELLITUS, TYPE II ---borderline  SLEEP APNEA--couldn't tol CPAP (has a machine) HYPERTENSION  HYPERLIPIDEMIA  DEPRESSION 2003-- was Dx w/  splenomegaly and mild ITP RBBB Dx 2009, neg stress ECHO, saw cards  Social History: Reviewed history from 10/21/2008 and no changes required. 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 Psych:  anxiuos , daughter was recently Dx w/ a  mediatinal tumor.  Physical Exam  General:  alert and well-developed.   Lungs:  normal respiratory effort, no intercostal retractions, no accessory muscle use, and  normal breath sounds.   Heart:  normal rate, regular rhythm, and no murmur.   Psych:  not anxious appearing and not depressed appearing.     Impression & Recommendations:  Problem # 1:  DIABETES MELLITUS, TYPE II (ICD-250.00) borderline, on diet only, again  encouraged diet and exercise   personal trainer?  His updated medication list for this problem includes:    Benicar Hct 40-25 Mg Tabs (Olmesartan medoxomil-hctz) .Marland Kitchen... Take one tablet daily  Orders: Venipuncture (04540) TLB-A1C / Hgb A1C (Glycohemoglobin) (83036-A1C)  Problem # 2:  HYPERTENSION (ICD-401.9) labs  His updated medication list for this problem includes:    Benicar Hct 40-25 Mg Tabs (Olmesartan medoxomil-hctz) .Marland Kitchen... Take one tablet daily    BP today: 120/80 Prior BP: 128/82 (10/21/2008)  Labs Reviewed: K+: 4.3 (10/28/2008) Creat: : 1.0 (10/28/2008)   Chol: 178 (10/28/2008)   HDL: 47.50 (10/28/2008)   LDL: 103 (10/28/2008)   TG: 137.0 (10/28/2008)  Orders: TLB-BMP (Basic Metabolic Panel-BMET) (80048-METABOL)  Problem # 3:  other issues  daughter has a medistinal tumor , counseled  flu shot provided  Complete Medication List: 1)  Zocor 40 Mg Tabs (Simvastatin) .Marland Kitchen.. 1 by mouth once daily 2)  Benicar Hct 40-25 Mg Tabs (Olmesartan medoxomil-hctz) .... Take one tablet daily 3)  Effexor 25 Mg Tabs (Venlafaxine hcl) .Marland Kitchen.. 1 by mouth two times a day 4)  Free Style Lite Test Strips  .... Use as directed twice daily  Other Orders: Admin 1st Vaccine (60454) Flu Vaccine 80yrs + ([address])  Patient Instructions: 1)  Please schedule a follow-up appointment in 6 months (physical, fasting)

## 2010-07-09 NOTE — Progress Notes (Signed)
Summary: ok #20, no further RF w/o OV  Phone Note Refill Request Call back at [phone] Message from:  Pharmacy on June 22, 2010 7:59 AM  Refills Requested: Medication #1:  VICODIN ES 7.5-750 MG TABS 1 by mouth every 6 hours as needed   Dosage confirmed as above?Dosage Confirmed   Brand Name Necessary? No   Supply Requested: 30   Last Refilled: 05/26/2010 [facility] [address].   Next Appointment Scheduled: 3.26.12 Initial call taken by: Harold Barban,  June 22, 2010 7:59 AM  Follow-up for Phone Call        having back pain since December. ok to call # 20 , no refills. Please arrange an office visit to see about his back pain. No further refills without office visit Follow-up by: Tripp Goins E. Kielan Dreisbach MD,  June 22, 2010 9:07 AM  Additional Follow-up for Phone Call Additional follow up Details #1::        pt has a f/u appt on 06/25/09. Army Fossa CMA  June 22, 2010 9:19 AM     Prescriptions: VICODIN ES 7.5-750 MG TABS (HYDROCODONE-ACETAMINOPHEN) 1 by mouth every 6 hours as needed  #20 x 0   Entered by:   Army Fossa CMA   Authorized by:   Nolon Rod. Dontez Hauss MD   Signed by:   Army Fossa CMA on 06/22/2010   Method used:   Printed then faxed to ...       [facility] [address] [facility]* (retail)       [address]       [place], [place]  [address]       Ph: [phone] or [phone]       Fax: [phone]   RxID:   [id number]

## 2010-07-09 NOTE — Progress Notes (Signed)
Summary: refill  Phone Note Refill Request Message from:  Fax from Pharmacy on April 13, 2010 8:59 AM  Refills Requested: Medication #1:  BENICAR HCT 40-25 MG  TABS TAKE ONE TABLET DAILY Jocelyn Lamer - fax [phone]  Initial call taken by: Okey Regal [place],  April 13, 2010 9:01 AM    Prescriptions: BENICAR HCT 40-25 MG  TABS (OLMESARTAN MEDOXOMIL-HCTZ) TAKE ONE TABLET DAILY  #90 x 0   Entered by:   Army Fossa CMA   Authorized by:   Nolon Rod. Paz MD   Signed by:   Army Fossa CMA on 04/13/2010   Method used:   Electronically to        [facility]* (mail-order)       [address] [place], [place]  [address]       Ph: [phone]       Fax: [phone]   RxID:   [id number]

## 2010-07-09 NOTE — Assessment & Plan Note (Signed)
Summary: HURT BACK OVER WKEND/RH......   Vital Signs:  Patient profile:   60 year old male Weight:      252.6 pounds Pulse rate:   76 / minute Pulse rhythm:   regular BP sitting:   120 / 80  (right arm) Cuff size:   small  Vitals Entered By: Almeta Monas CMA Duncan Dull) (May 11, 2010 11:18 AM) CC: per pt --He hurt his back x2days ago moving furniture around the house, Back Pain   History of Present Illness:       This is a 60 year old man who presents with Back Pain.  The symptoms began 2 days ago.  Pt was working in yard over weekend and immediately felt pain in low back with turning.  Pt had surgery with Dr Lovell Sheehan 10 years ago.  The patient denies fever, chills, weakness, loss of sensation, fecal incontinence, urinary incontinence, urinary retention, dysuria, rest pain, inability to work, and inability to care for self.  The pain is located in the left low back.  The pain began at home and after overuse.  The pain radiates to the right buttock and left buttock.  The pain is made worse by standing or walking and activity.  The pain is made better by inactivity.    Current Medications (verified): 1)  Zocor 40 Mg  Tabs (Simvastatin) .Marland Kitchen.. 1 By Mouth Once Daily 2)  Benicar Hct 40-25 Mg  Tabs (Olmesartan Medoxomil-Hctz) .... Take One Tablet Daily 3)  Effexor 25 Mg  Tabs (Venlafaxine Hcl) .Marland Kitchen.. 1 By Mouth Two Times A Day 4)  Free Style Lite Test Strips .... Use As Directed Twice Daily 5)  Flexeril 10 Mg Tabs (Cyclobenzaprine Hcl) .Marland Kitchen.. 1 By Mouth Three Times A Day As Needed 6)  Vicodin Es 7.5-750 Mg Tabs (Hydrocodone-Acetaminophen) .Marland Kitchen.. 1 By Mouth Every 6 Hours As Needed  Allergies (verified): 1)  ! Sulfa  Past History:  Past Medical History: Last updated: 04/27/2010 DIABETES MELLITUS, TYPE II ---borderline  SLEEP APNEA--couldn't tol CPAP (has a machine) HYPERTENSION  HYPERLIPIDEMIA  DEPRESSION 2003-- was Dx w/  splenomegaly and mild ITP RBBB Dx 2009, neg stress ECHO, saw  cards Occ LBP even after surgery in the back 2001  Past Surgical History: Last updated: Oct 24, 2008 Inguinal herniorrhaphy (1977) Appendectomy (1991) Lumbar laminectomy (2001) Dr Lovell Sheehan, still has mild pain R shoulder surgery 07-2008  Family History: Last updated: Oct 24, 2008 CAD - F deceased 74 (MI), bro HTN - M DM - M, sister stroke - sister (deceased) cirrhosis - sister (no ETOH) colon Ca - no prostate Ca - no breast Ca - sister  Social History: Last updated: 04/27/2010 married 2 children occupation-- Production designer, theatre/television/film for a Designer, 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 CAD - F deceased 34 (MI), bro HTN - M DM - M, sister stroke - sister (deceased) cirrhosis - sister (no ETOH) colon Ca - no prostate Ca - no breast Ca - sister  Social History: Reviewed history from 04/27/2010 and no changes required. 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  Pt walking very carefully and bent over Psych:  Oriented X3 and normally interactive.     Impression & Recommendations:  Problem # 1:  BACK PAIN (ICD-724.5)  His updated medication list for this problem includes:    Flexeril 10 Mg Tabs (Cyclobenzaprine hcl) .Marland Kitchen... 1 by mouth three times  a day as needed    Vicodin Es 7.5-750 Mg Tabs (Hydrocodone-acetaminophen) .Marland Kitchen... 1 by mouth every 6 hours as needed  Discussed use of moist heat or ice, modified activities, medications, and stretching/strengthening exercises. Back care instructions given. To be seen in 2 weeks if no improvement; sooner if worsening of symptoms.   Complete Medication List: 1)  Zocor 40 Mg Tabs (Simvastatin) .Marland Kitchen.. 1 by mouth once daily 2)  Benicar Hct 40-25 Mg Tabs (Olmesartan medoxomil-hctz) .... Take one tablet daily 3)  Effexor 25 Mg Tabs (Venlafaxine hcl) .Marland Kitchen.. 1 by mouth two times a day 4)  Free Style Lite Test Strips  .... Use as directed twice daily 5)  Flexeril 10 Mg Tabs (Cyclobenzaprine hcl) .Marland Kitchen.. 1 by mouth three times a day as needed 6)  Vicodin Es 7.5-750 Mg Tabs (Hydrocodone-acetaminophen) .Marland Kitchen.. 1 by mouth every 6 hours as needed  Patient Instructions: 1)  Most patients (90%) with low back pain will improve with time ( 2-6 weeks). Keep active but avoid activities that are painful. Apply moist heat and/or ice to lower back several times a day.  Prescriptions: VICODIN ES 7.5-750 MG TABS (HYDROCODONE-ACETAMINOPHEN) 1 by mouth every 6 hours as needed  #30 x 0   Entered and Authorized by:   Loreen Freud DO   Signed by:   Loreen Freud DO on 05/11/2010   Method used:   Print then Give to Patient   RxID:   [id number] FLEXERIL 10 MG TABS (CYCLOBENZAPRINE HCL) 1 by mouth three times a day as needed  #30 x 0   Entered and Authorized by:   Loreen Freud DO   Signed by:   Loreen Freud DO on 05/11/2010   Method used:   Print then Give to Patient   RxID:   [id number]    Orders Added: 1)  Est. Patient Level III [84696]

## 2010-07-09 NOTE — Progress Notes (Signed)
Summary: always wants [facility] in [place]  Phone Note Call from Patient   Caller: Patient Summary of Call: patient requests that we flag his account to show that all prescriptions should go to the [facility], [address], [place]-----NOT the [facility] on [address] in [place]??    Can you flag it for him??  Is this something that I can do in the future??  thanks Initial call taken by: Jerolyn Shin,  May 27, 2010 2:23 PM  Follow-up for Phone Call        Corrected pharmacy in registration portion of the chart. Follow-up by: Lucious Groves CMA,  May 27, 2010 2:36 PM

## 2010-07-09 NOTE — Progress Notes (Signed)
Summary: Lipitor refill  Phone Note Refill Request Message from:  Patient on May 27, 2010 12:18 PM  Refills Requested: Medication #1:  LIPITOR 20 MG TABS 1 by mouth qd. patient is on his way to [facility] [address] [address]  (not [employer], [place]) to pick up another prescription---can this one get called in as soon as possible??   he got the mail order from 12/7, but the [facility] on [address] had no record of this prescription from 12/7  Initial call taken by: Jerolyn Shin,  May 27, 2010 12:19 PM    Prescriptions: LIPITOR 20 MG TABS (ATORVASTATIN CALCIUM) 1 by mouth qd  #90 x 0   Entered by:   Lucious Groves CMA   Authorized by:   Nolon Rod. Treavon Castilleja MD   Signed by:   Lucious Groves CMA on 05/27/2010   Method used:   Faxed to ...       [facility] [id number] (retail)       [address]       [place], [place]  [address]       Ph: [phone] or [phone]       Fax: [phone]   RxID:   [id number]

## 2010-07-15 NOTE — Progress Notes (Signed)
Summary: refill  Phone Note Refill Request Message from:  Fax from Pharmacy on July 09, 2010 2:05 PM  Refills Requested: Medication #1:  VICODIN ES 7.5-750 MG TABS 1 by mouth every 6 hours as needed [address] - fax [phone] --phone [phone]  Initial call taken by: Okey Regal [place],  July 09, 2010 2:07 PM  Follow-up for Phone Call        last refilled 06/22/10. Army Fossa CMA  July 10, 2010 7:36 AM  40, no Rf Curtez Brallier E. Jimmye Wisnieski MD  July 10, 2010 10:21 AM     Prescriptions: VICODIN ES 7.5-750 MG TABS (HYDROCODONE-ACETAMINOPHEN) 1 by mouth every 6 hours as needed  #40 x 0   Entered by:   Army Fossa CMA   Authorized by:   Nolon Rod. Maralee Higuchi MD   Signed by:   Army Fossa CMA on 07/10/2010   Method used:   Printed then faxed to ...       [facility] [id number] (retail)       [address]       [place], [place]  [address]       Ph: [phone] or [phone]       Fax: [phone]   RxID:   [id number]

## 2010-08-13 ENCOUNTER — Ambulatory Visit (INDEPENDENT_AMBULATORY_CARE_PROVIDER_SITE_OTHER): Payer: BC Managed Care – PPO | Admitting: Internal Medicine

## 2010-08-13 ENCOUNTER — Encounter: Payer: Self-pay | Admitting: Internal Medicine

## 2010-08-13 DIAGNOSIS — R5383 Other fatigue: Secondary | ICD-10-CM

## 2010-08-13 DIAGNOSIS — R5381 Other malaise: Secondary | ICD-10-CM | POA: Insufficient documentation

## 2010-08-18 NOTE — Assessment & Plan Note (Signed)
Summary: knot on his head and fatigue--PH   Vital Signs:  Patient profile:   60 year old male Weight:      251 pounds Pulse rate:   82 / minute Pulse rhythm:   regular BP sitting:   126 / 84  (left arm) Cuff size:   regular  Vitals Entered By: Army Fossa CMA (August 13, 2010 11:00 AM) CC: Knot on forehead -started tuesday Comments x 2 weeks felt very fatigued [facility]  Trying to stop Effexor    History of Present Illness:  2 days ago, he noticed a knot @ the forehead;  there was no pain, area seems to be smaller now.  Complains also of feeling fatigued for the last 2 weeks , recognize that he has been very busy lately and he has a lot of stress at work   at the same time, he developed generalized aches but  both the aches and fatigue are better x the last 2 days    Review of systems  no recent fever, runny nose or flu symptoms  no nausea, vomiting, diarrhea or blood in the stools No chest pain, dyspnea on exertion or edema  despite stress at work, he does not  feel anxious or depressed per se  he snores    Current Medications (verified): 1)  Benicar Hct 40-25 Mg  Tabs (Olmesartan Medoxomil-Hctz) .... Take One Tablet Daily 2)  Effexor 25 Mg  Tabs (Venlafaxine Hcl) .Marland Kitchen.. 1 By Mouth Two Times A Day 3)  Free Style Lite Test Strips .... Use As Directed Twice Daily 4)  Flexeril 10 Mg Tabs (Cyclobenzaprine Hcl) .Marland Kitchen.. 1 By Mouth Three Times A Day As Needed 5)  Vicodin Es 7.5-750 Mg Tabs (Hydrocodone-Acetaminophen) .Marland Kitchen.. 1 By Mouth Every 6 Hours As Needed 6)  Lipitor 20 Mg Tabs (Atorvastatin Calcium) .Marland Kitchen.. 1 By Mouth Qd  Allergies (verified): 1)  ! Sulfa  Past History:  Past Medical History: Reviewed history from 04/27/2010 and no changes required. DIABETES MELLITUS, TYPE II ---borderline  SLEEP APNEA--couldn't tol CPAP (has a machine) HYPERTENSION  HYPERLIPIDEMIA  DEPRESSION 2003-- was Dx w/  splenomegaly and mild ITP RBBB Dx 2009, neg stress ECHO, saw  cards Occ LBP even after surgery in the back 2001  Past Surgical History: Reviewed history from 10/21/2008 and no changes required. Inguinal herniorrhaphy (9562) Appendectomy (1308) Lumbar laminectomy (2001) Dr Lovell Sheehan, still has mild pain R shoulder surgery 07-2008  Physical Exam  General:  alert and well-developed.   Head:   face is symmetric except for a very subtle swelling at the right temple , 2x2 cm, no red-fluctuant-tender. T.A. palpated and normal B  Lungs:  normal respiratory effort, no intercostal retractions, no accessory muscle use, and normal breath sounds.   Heart:  normal rate, regular rhythm, and no murmur.   Extremities:  no lower extremity edema Psych:  not anxious appearing and not depressed appearing.     Impression & Recommendations:  Problem # 1:  FATIGUE (ICD-780.79)  2 weeks history of fatigue and  joint aches. No URI type of symptoms. viral  infection?  review of systems is essentially negative  for "red flags" Plan:  observation, he will return to the office in about 2 weeks for his CPX, . He will also call me if symptoms get worse in the next few days.  Problem # 2:  temple swelling  etiology unclear, no history of any injury. Compared to yesterday, the area seems much smaller per patient  Plan: Observation  Complete Medication List: 1)  Lipitor 20 Mg Tabs (Atorvastatin calcium) .Marland Kitchen.. 1 by mouth qd 2)  Benicar Hct 40-25 Mg Tabs (Olmesartan medoxomil-hctz) .... Take one tablet daily 3)  Effexor 25 Mg Tabs (Venlafaxine hcl) .Marland Kitchen.. 1 by mouth two times a day 4)  Free Style Lite Test Strips  .... Use as directed twice daily 5)  Flexeril 10 Mg Tabs (Cyclobenzaprine hcl) .Marland Kitchen.. 1 by mouth three times a day as needed 6)  Vicodin Es 7.5-750 Mg Tabs (Hydrocodone-acetaminophen) .Marland Kitchen.. 1 by mouth every 6 hours as needed   Orders Added: 1)  Est. Patient Level III [72536]

## 2010-08-31 ENCOUNTER — Encounter: Payer: Self-pay | Admitting: Internal Medicine

## 2010-08-31 ENCOUNTER — Ambulatory Visit (INDEPENDENT_AMBULATORY_CARE_PROVIDER_SITE_OTHER): Payer: BC Managed Care – PPO | Admitting: Internal Medicine

## 2010-08-31 DIAGNOSIS — M549 Dorsalgia, unspecified: Secondary | ICD-10-CM

## 2010-08-31 DIAGNOSIS — E785 Hyperlipidemia, unspecified: Secondary | ICD-10-CM

## 2010-08-31 DIAGNOSIS — Z0000 Encounter for general adult medical examination without abnormal findings: Secondary | ICD-10-CM

## 2010-08-31 DIAGNOSIS — M25519 Pain in unspecified shoulder: Secondary | ICD-10-CM

## 2010-08-31 LAB — TSH: TSH: 1.07 u[IU]/mL (ref 0.35–5.50)

## 2010-08-31 NOTE — Assessment & Plan Note (Addendum)
Problems on-off , needs a referral to a specialist Will send pt to Dr Ethelene Hal

## 2010-08-31 NOTE — Patient Instructions (Signed)
Diet Exercise 30 minutes a day at least  Will refer you to a back specialist, if you don't  hear from [place] in one week let us know

## 2010-08-31 NOTE — Progress Notes (Signed)
  Subjective:    Patient ID: Edward Wilkerson, male    DOB: [birth year], 60 y.o.   MRN: [id number]  HPI CPX Seen w/ fatigue and aswelling in the temple recently  -------> sx  resolved   Review of Systems  Respiratory: Negative for cough and shortness of breath.   Cardiovascular: Negative for chest pain, palpitations and leg swelling.  Gastrointestinal: Negative for nausea, vomiting, diarrhea and blood in stool.  Genitourinary: Negative for dysuria, urgency and hematuria.     Past Medical History  Diagnosis Date  . DM type 2 (diabetes mellitus, type 2)     bordeline  . Sleep apnea     could'nt tol CPAP (has a machine)   . Hypertension   . Hyperlipidemia   . Depression   . 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 RBBB (right bundle branch block with left anterior fascicular block) 2009    neg stress ECHO, saw cards    Past Surgical History  Procedure Date  . Inguinal hernia repair 1977  . Appendectomy 1991  . Lumbar laminectomy 2001    Dr.Jenkins, still has mild pain  . Shoulder surgery 07/2008    (Right)   Social History: married 2 children occupation-- Production designer, theatre/television/film for a local company  Never Smoked ETOH --socially  Drug use-no       Objective:   Physical Exam  Constitutional: He is oriented to person, place, and time. He appears well-developed.        Moderately overweight  HENT:  Head: Normocephalic and atraumatic.  Nose: Nose normal.  Eyes: Left eye exhibits no discharge. No scleral icterus.  Neck: Normal range of motion. Neck supple.  Cardiovascular: Normal rate, regular rhythm, normal heart sounds and intact distal pulses.   No murmur heard. Pulmonary/Chest: Effort normal and breath sounds normal. No respiratory distress. He has no wheezes. He has no rales. He exhibits no tenderness.  Abdominal: Bowel sounds are normal. He exhibits no distension. There is no tenderness. There is no rebound and no guarding.  Genitourinary: Rectum normal and prostate normal.    Neurological: He is alert and oriented to person, place, and time.  Psychiatric: He has a normal mood and affect. His behavior is normal. Judgment and thought content normal.          Assessment & Plan:

## 2010-08-31 NOTE — Assessment & Plan Note (Addendum)
Td 2005   3th Cscope  per pt was 2010, next in 5 years   (Dr Josue Hector @ [facility]) labs  long discussion about diet-exercise

## 2010-08-31 NOTE — Assessment & Plan Note (Signed)
Most recent cholesterol panel was December 2011, LDL was 119. He has borderline diabetes. LDL goal is 100, we'll continue with same dose of simvastatin and encourage more diet and exercise. Recheck another cholesterol panel in  Few months, if needed we'll adjust medication.

## 2010-09-02 ENCOUNTER — Telehealth: Payer: Self-pay | Admitting: *Deleted

## 2010-09-02 NOTE — Telephone Encounter (Signed)
Pt is aware of lab results.

## 2010-09-02 NOTE — Telephone Encounter (Signed)
Message copied by Army Fossa on Wed Sep 02, 2010  9:26 AM ------      Message from: Willow Ora      Created: Tue Sep 01, 2010  5:04 PM       Advise patient:       labs normal

## 2010-09-16 ENCOUNTER — Telehealth: Payer: Self-pay

## 2010-09-16 ENCOUNTER — Other Ambulatory Visit: Payer: Self-pay | Admitting: *Deleted

## 2010-09-16 LAB — COMPREHENSIVE METABOLIC PANEL
AST: 33 U/L (ref 0–37)
BUN: 17 mg/dL (ref 6–23)
CO2: 22 mEq/L (ref 19–32)
GFR calc Af Amer: >60 mL/min (ref >60)
Glucose, Bld: 115 mg/dL — ABNORMAL HIGH (ref 70–99)
Potassium: 3.5 mEq/L (ref 3.5–5.1)
Total Bilirubin: 0.6 mg/dL (ref 0.3–1.2)

## 2010-09-16 LAB — DIFFERENTIAL
Basophils Absolute: 0 10*3/uL (ref 0.0–0.1)
Basophils Relative: 1 % (ref 0–1)
Lymphs Abs: 1.4 10*3/uL (ref 0.7–4.0)
Neutro Abs: 3.2 10*3/uL (ref 1.7–7.7)
Neutrophils Relative %: 63 % (ref 43–77)

## 2010-09-16 LAB — CBC
HCT: 42.7 % (ref 39.0–52.0)
MCHC: 34.8 g/dL (ref 30.0–36.0)
MCV: 92.1 fL (ref 78.0–100.0)
Platelets: 102 10*3/uL — ABNORMAL LOW (ref 150–400)
RDW: 12.5 % (ref 11.5–15.5)

## 2010-09-16 LAB — GLUCOSE, CAPILLARY: Glucose-Capillary: 114 mg/dL — ABNORMAL HIGH (ref 70–99)

## 2010-09-16 MED ORDER — HYDROCODONE-ACETAMINOPHEN 7.5-750 MG PO TABS: 1.0000 | ORAL_TABLET | Freq: Four times a day (QID) | ORAL | Status: DC | PRN

## 2010-09-16 NOTE — Telephone Encounter (Signed)
Faxed.   KP 

## 2010-09-16 NOTE — Telephone Encounter (Signed)
Paz pt- last ov 08/31/10 last refilled 08/20/10 #40, no refills.

## 2010-09-22 LAB — POCT I-STAT 4, (NA,K, GLUC, HGB,HCT)
Hemoglobin: 16 g/dL (ref 13.0–17.0)
Sodium: 142 mEq/L (ref 135–145)

## 2010-09-22 LAB — GLUCOSE, CAPILLARY: Glucose-Capillary: 124 mg/dL — ABNORMAL HIGH (ref 70–99)

## 2010-10-06 ENCOUNTER — Other Ambulatory Visit: Payer: Self-pay | Admitting: *Deleted

## 2010-10-06 MED ORDER — OLMESARTAN MEDOXOMIL-HCTZ 40-25 MG PO TABS: 1.0000 | ORAL_TABLET | Freq: Every day | ORAL | Status: DC

## 2010-10-12 ENCOUNTER — Other Ambulatory Visit: Payer: Self-pay | Admitting: Internal Medicine

## 2010-10-13 NOTE — Telephone Encounter (Signed)
Ok #30, no RF 

## 2010-10-20 ENCOUNTER — Other Ambulatory Visit: Payer: Self-pay | Admitting: *Deleted

## 2010-10-20 NOTE — Assessment & Plan Note (Signed)
Mr. Cirelli is back in today for a refill of his pain medications.   He is back in today and states that he has been a bit more active with  the holiday's coming up.  He has attempted to engage in aerobics  conditioning program with his wife, watching a tape.  He also had an  incident where he misstepped on the stairs and landed hard on his right  leg.  He did not fall but he stepped hard onto the leg.   He states his average pain is between a 5 and a 7 on a scale of 10,  predominantly in the low back pain but the right ankle is bothering him  quite a bit as well intermittently, worse in the morning.  Stiffness is  noted in the morning around the right ankle region.  He gets good relief  with the current medications.  He is able to walk about an hour.  He is  working 40 hours a week.   No changes in past medical, social, or family history since last visit.   MEDICATIONS:  Provided by our clinic include:  1. Norco 5/325 up to 3 to 4 times a day #100 last month.  2. Naprosyn 500, 1 p.o. b.i.d. on a p.r.n. basis.  3. Effexor XR 75 mg 1 p.o. q.a.m.  4. Neurontin 300 mg at h.s.   PHYSICAL EXAMINATION:  VITAL SIGNS:  Stable today.  GENERAL:  He is well-developed, well-nourished gentleman who does not  appear in any distress.  He is oriented x3.  Speech is clear.  Affect is  bright.  He alert, cooperative, and pleasant.  Follows commands without  any problems.  MUSCULOSKELETAL:  Transition from sitting to standing is done with ease.  He has good balance.  Limitations are minimal with respect to the  forward flexion.  He has some limitations in lumbar extension.  NEUROLOGICAL:  Tandem gait and Romberg tests are performed adequately.  He is able to heel/toe walk without difficulty.  Reflexes are  symmetrically intact in the lower extremities.  No abnormal tone is  noted.  Motor strength is 5/5 at hip flexors, knee extension,  dorsiflexion, or spinous flexors, and EHL.  EXTREMITIES:   Examination of the right ankle reveals exquisite  tenderness about 1 to 2 cm from the insertion of the Achilles tendon.  No swelling is appreciated, however, he is quite tender.  He has a  milder amount of tenderness in the left ankle.   IMPRESSION:  1. New right Achilles tendinitis.  2. Lumbago.  3. Status post laminectomy 2001, Dr. Lovell Sheehan.  4. Lumbar spondylosis.  5. Degenerative disk disease.  6. History of mild depression/anxiety.  7. History of insomnia.   PLAN:  1. We will refill the following medications for Mr. Hinch today:      a.     We will continue him on Norco 5/325 up to 3 to 4 times a day       #100 per month.      b.     Prilosec 20 mg 1 p.o. q. day #30 with a refill.      c.     We will start him on Naprosyn for the next 10 days 500 mg 1       p.o. b.i.d. #20, with 1 refill.  2. I have asked him to curtail his aerobic activity until his right      ankle calms down.  I would like him  to ice the ankle, if he is able      to, at least once to 3 times a day.  Use a shoe with approximately      a 1-inch heel if he has that      available to him as well.  If he is not improving, we will consider      physical therapy in the next month or so.  3. We will have him followup with nursing visit next month and I will      see him back in 2 months.           ______________________________  Brantley Stage, M.D.     DMK/MedQ  D:  05/10/2007 11:44:27  T:  05/10/2007 13:53:27  Job #:  [id number]

## 2010-10-20 NOTE — Assessment & Plan Note (Signed)
Edward Wilkerson is a 60 year old married gentleman who is being seen in our  pain and rehab clinic for chronic low back pain.  He is status post  laminectomy by Dr. Lovell Sheehan in 2001.  He has intermittent flareups of  degenerative disk disease and lumbar spondylosis.   He is back in today for refill of his medications.  He states that he  has recently been diagnosed as diabetic, and he is being told to attempt  to control it with weight loss and nutrition.  He states that his  average pain is about a 3 on a scale of 10.  Today it is about a 5.  His  sleep is fair.  He gets good relief from the medications.  The pain is  typically located in the low back region.  He is able to walk an  indefinite amount of time.  He is able to drive.  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   He denies depression, anxiety, or suicidal ideation.  No changes in his  past medical history other than previously noted.   Medications provided by this clinic include Neurontin 300 mg twice  daily, Effexor 75 mg once daily, Prilosec 20 mg daily, Norco 5/325 up to  4 times a day, Naprosyn 1 tablet up to twice a day on a p.r.n. basis, no  more than 20 tablets for a month.   PHYSICAL EXAMINATION:  VITAL SIGNS:  Blood pressure 134/75, pulse 91,  respirations 18, 97% saturated on room air.  GENERAL:  He is a well-developed and well-nourished gentleman, mildly  obese.  Does not appear in any distress.  He is oriented x3.  Speech is  clear.  Affect is bright, alert, cooperative, and pleasant.  He follows  commands without difficulty.  NEUROMUSCULAR:  He transitions from sitting to standing without any  problems.  Gait in the room is not antalgic.  Forward flexion and  extension are only slightly limited.  Seated reflexes are symmetric and  intact in the lower extremities.  No abnormal tone is noted.  Motor  strength is excellent in both lower extremities.  No sensory deficits  are appreciated.   IMPRESSION:  1. Lumbago.  2. Status post laminectomy in 2001 by Dr. Lovell Sheehan.  3. Lumbar spondylosis.  4. Degenerative disk disease.  5. Mild depression/anxiety, well controlled with Effexor 75 mg 1 p.o.      daily.  6. History of insomnia, currently not a problem at this time.   PLAN:  We will decrease his Norco from five times a day down to four  times a day, 5/325 q.i.d. #120.  No refills.  Will also decrease  Neurontin from 300 mg twice daily down to 300 mg nightly.  Will see him  back in a month and anticipate reducing dosage further down to 3 times a  day.  Would like to get to the point where he uses Norco only  sporadically for flareups.  I have encouraged him to continue to walk  and engage in therapeutic exercise program.  I will see him back in a  month.           ______________________________  Brantley Stage, M.D.     DMK/MedQ  D:  12/21/2006 11:38:35  T:  12/21/2006 23:49:42  Job #:  [id number]

## 2010-10-20 NOTE — Assessment & Plan Note (Signed)
[name] is a 60 year old married gentleman who is working full  time as a Production designer, theatre/television/film.  He is followed in our Pain and Rehabilitative Clinic  for chronic low back pain.  He is status post lumbar laminectomy in 2001  by Dr. Tressie Stalker.  He has known lumbar spondylosis and  degenerative disk disease.   He is back in today for brief recheck.  He does not need any refills  currently on his medications.  He takes Ultram 2 tablets in the morning,  1 in the mid-day, and 2 at night as well as Neurontin 300 mg at night  for his back pain.  He also uses Effexor 25 mg every other day to help  manage anxiety/depression.   He is back in today.  Reports his average pain is about a 3 on a scale  of 10 in the clinic and the average is around a 5.  He describes the  pain is intermittent and dull in nature, predominantly in the left low  back region, worse with standing and certain activities, but improves  with rest and medication, gets fair relief with current meds.   FUNCTIONAL STATUS:  He is able to walk 15 minutes at a time.  He is able  to climb stairs.  He is driving.  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  He is independent with self-care.   REVIEW OF SYSTEMS:  Noncontributory and is negative.  He follows up with  Dr. Drue Novel for primary care needs.   PAST MEDICAL, SOCIAL, OR FAMILY HISTORY:  Otherwise unchanged.   PHYSICAL EXAMINATION:  VITAL SIGNS:  Blood pressure is 134/70, pulse 92,  respirations 18, and 96% saturated on room air.  GENERAL:  He is a well-developed, obese gentleman, who does not appear  in any distress.   He is oriented x3.  Speech is clear.  Affect is bright.  He is alert,  cooperative, and pleasant.  Follows commands easily.   Cranial nerves are grossly intact.  Coordination is intact.  No sensory  deficits are appreciated.  Reflexes are symmetric and intact in lower  extremities, 2+ at the patellar tendon, and 1+ at the Achilles tendon.  No abnormal  tone is noted.  No clonus is noted.   He has 5/5 strength at hip flexors, knee extensors, dorsiflexors, planar  flexors, and EHL.  Internal and external rotation at the hip does not  increase his low back or give him any groin pain.   Limited motion in the lumbar spine is appreciated in all planes.  Straight leg raise is negative.   IMPRESSION:  1. Lumbar spondylosis/degenerative disk disease.  2. Status post laminectomy in 2001 by Dr. Lovell Sheehan for chronic low back      pain.  3. History of depression and anxiety, currently on Effexor 25 mg every      other day.   PLAN:  Continue to encourage him to walk daily.  He is apparently  walking up to 15 minutes a day when he can.  He is using Ultram 2  tablets in the morning, 1 at noon, and 2 at night.  He is doing well on  this.  His pain today is about a 3 on a scale of 10.  He also uses  Neurontin at night and intermittent Naprosyn for flare-up.  I have  written him prescription for Naprosyn today 500 mg 1 p.o. daily to  b.i.d. p.r.n. back pain, #30.  I  have educated him not to use more than  about 10 pills a month to decrease overall side effects.   Mr. Day has done well coming off his hydrocodone and seems to be  doing well with the Ultram and remains functioning on a fairly high  level, working full time, and also engaging in exercise.  I will see him  back in 3 months.  He will call us when he needs refills of his medication.  Mr. Lazar  has been taking his medications as prescribed.  He has not exhibited any  aberrant behavior and continues to function at a very high level.           ______________________________  Brantley Stage, M.D.     DMK/MedQ  D:  03/25/2008 10:12:21  T:  03/25/2008 23:21:46  Job #:  [id number]   cc:   Willow Ora, MD  [phone] [place], [place] [address]

## 2010-10-20 NOTE — Assessment & Plan Note (Signed)
Edward Wilkerson is a 60 year old gentleman who is followed in our Pain and  Rehabilitative Clinic for chronic low back pain and intermittent right  leg pain.  He is back in today and is complaining of some increased  right leg pain, which gets worse when he is walking for any period of  time.  Apparently, he had a stress test.  He brings in the results of  that today, which showed that he was diagnosed with a bundle-branch  block but had an overall normal stress echo, with no evidence of  coronary artery disease.  He states that during his stress test he did  have some right leg pain, which he complained about, however he was able  to complete the full test despite the leg pain.   His average pain is 3 to 4 to 5 on a scale of 10.  Pain is typically  worse with bending, sitting, and prolonged walking and improves with  rest and medication.  He gets a little relief with current medications  prescribed through this clinic.   His mobility is as follows:  He is able to walk about 30 minutes at a  time.  He is able to drive.  He is able to climb stairs.  He is working  40 hours a week.  He is independent with all of his self-care.   Review of systems is noncontributory today.  Past medical, social, and  family history are also unchanged.   PHYSICAL EXAMINATION:  VITAL SIGNS:  Blood pressure is 141/72, pulse  103, respirations 18, and 97% saturated on room air.  GENERAL:  He is a moderately obese gentleman who appears his stated age.  He does not appear in any distress.  He is oriented x3.  Speech is  clear.  Affect is bright.  He is alert, cooperative, and pleasant.  He  follows commands without any difficulty.  NEUROLOGIC:  Cranial nerves are grossly intact.  Coordination is intact.  Reflexes are 1+ at patellar and Achilles tendons.  No abnormal tone is  noted.  No clonus is noted.  Sensory exam is intact to light touch.  Strength is 5/5 at hip flexors, knee extensors, dorsiflexors, plantar  flexors, and EHL.  Straight leg raise is negative.   He has some complaints of right leg pain and reported some tenderness  with palpation of the right calf.  This was measured at 14 cm below the  knee, and his circumferences were symmetric from right to left.   He has mild limitations, especially with extension, with respect to his  lumbar spine.  He has no pain with internal and external rotation of the  hip.   IMPRESSION:  1. Lumbar spondylosis/degenerative disk disease.  2. Status post laminectomy in 2001 by Dr. Lovell Sheehan for chronic low back      pain.  3. History of depression and anxiety.  He is currently taking his      Effexor 25 mg 1 pill about every other day now.   PLAN:  I encouraged him to continue walking daily.  I will refill his  Ultram 1 to 2 tablets p.o. q.a.m., 1 p.o. q.noon, 1 p.o. q.p.m., and 1  p.o. nightly, 2 refills.  Ultram is to be used on a p.r.n. basis for  back or right leg pain.  Also suggested that he trial his Naprosyn for  increased right leg pain intermittently, not more than 5 days at a time,  however.  We will refill his  Neurontin 300 mg 1 p.o. t.i.d., #90, with 2  refills.   Mr. Elmquist is quite interested in returning back to his previous pain  medication hydrocodone, however would like to continue his Ultram at  this time.  At this point, I will see him back in 2 months.  He is quite  functional on the  Ultram, and he seems to be tolerating it fairly well; initially, when he  first took it he had some mild nausea.  We will see him back in 2  months.           ______________________________  Brantley Stage, M.D.     DMK/MedQ  D:  01/26/2008 12:21:30  T:  01/27/2008 02:12:17  Job #:  [id number]

## 2010-10-20 NOTE — Assessment & Plan Note (Signed)
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  He is back in today for a brief recheck and refill of his  medications.   Last month, his Effexor was decreased slightly.  He is taking 25 mg  twice a day and every other day taking one 25 mg tablet.  He states he  has been doing quite well with this.   His medical history has been complicated in the last month by  bronchitis, which is being treated with antibiotics through Dr. Leta Jungling  office.   While in the office, he had an EKG and some changes were apparently  noted and Edward Wilkerson is currently being scheduled for a cardiac stress  test.   Edward Wilkerson's average pain is about 5 on a scale of 10.  He describes it  as fairly constant low back pain, and he also has some mild  intrascapular pain as well today.  He states his sleep is fair.  His  activity is moderately interfered by his pain.  He continues to work 40  hours a week, however, pain is typically worse with bending, improves  with rest and medication and he reports good relief with the current  medications that he is on.   He is able to walk about 40 minutes at a time.  He is able to climb  stairs.  He is driving.  He is independent with all of his self cares.  He is a high-functioning gentleman overall.   REVIEW OF SYSTEMS:  Positive for respiratory infection, coughing,  shortness of breath, wheezing, and is currently followed by Dr. Drue Novel for  these symptoms.   No other changes in social or family histories since last visit.  He  does have a positive family history.  Father who died at age 9 of  myocardial infarction.   MEDICATIONS:  Prescribed by this clinic include:  1. Effexor 25 1 p.o. b.i.d. on even days and 1 p.o. daily on odd days.  2. Norco 5/325 1 p.o. b.i.d. to t.i.d. not more than 90 tablets per      month.  3. Prilosec.  4. 300 mg of Neurontin at night.   PHYSICAL EXAMINATION:  Today, blood pressure is 111/72, pulse is  83,  respirations 18, and 99% saturate on room air.  He is a well-developed, well-nourished, and mildly obese gentleman who  does not appear in any distress.  He appears as stated age.  He is  oriented x3.  Speech is clear.  His affect is bright.  He is alert,  cooperative, pleasant, and talkative.   He transitions from sitting to standing quickly.  He displays no pain  behaviors in the room today.  Gait is normal.  Tandem gait, Romberg test  are all performed adequately.  End range forward flexion bothers him  somewhat in the low back as well as end range with lumbar extension.  He  does have some limitations in both these movements.   Seated reflexes are symmetric and intact in the lower extremities.  Straight leg raise is negative.  Motor strength is excellent without  focal deficits.   No changes in sensory exam are noted as well.   IMPRESSION:  1. Lumbar spondylosis/degenerative disc disease.  2. Status post laminectomy 2001, Dr. Lovell Sheehan, with chronic low back      pain.  3. History of depression/anxiety, currently on Effexor tapering dose.   PLAN:  We will decrease his Effexor  today to 25 mg daily.  We will  continue Norco 5/325 three times a day.   Encourage him to follow up with Dr. Drue Novel for his respiratory problems and  would like to be kept up with any new information regarding his stress  test.   Over the last 3 months, Edward Wilkerson has been engaging in a walking  program and we will have him hold off until after his stress test.   Edward Wilkerson is a high-functioning gentleman who takes his medications as  prescribed.  His pill counts are appropriate.  He does not exhibit any  aberrant behavior.  We will see him back in a month.           ______________________________  Brantley Stage, M.D.     DMK/MedQ  D:  11/20/2007 09:15:19  T:  11/20/2007 21:56:56  Job #:  [id number]   cc:   Willow Ora, MD  [phone] [address] [place], [place] [address]

## 2010-10-20 NOTE — Assessment & Plan Note (Signed)
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  He is back in today for a refill of his medications.   He is being followed in our pain and rehabilitative clinic for chronic  low back pain and occasional right lower extremity pain.  He had a  laminectomy back in 2001 and has chronic degenerative disk disease.  He  is also being treated for some anxiety and depression.  Overall, he has  been doing well functionally.   He brings in his activity sheet today which shows an overall improvement  in his activity from last month.  On three days he walked 45 minutes.  On three days last month he walked 60 minutes and 14 days he walked 30  minutes.  He is currently able to walk up to 60 minutes at a time.  He  is independent.  He is a high functioning gentleman.   He reports his average pain is about a 4 on a scale of 10.  In the  clinic today, it is at a 2.  He reports very little to mildly moderate  interference of his activity from his pain.  The pain is localized to  mainly the left low back area.  It is worse with sitting.  It improves  with medication.  He gets good relief with his meds.   REVIEW OF SYSTEMS:  Noncontributory.   PRIMARY CARE DOCTOR:  His primary care, Dr. Blossom Hoops, has retired from  his practice and he is thinking about getting a new primary care doctor  who he believes may be Dr. Willow Ora.   PAST MEDICAL HISTORY:  No changes since our last visit.   SOCIAL HISTORY:  No changes since our last visit.   FAMILY HISTORY:  No changes since our last visit.   MEDICATIONS:  Prescribed by our clinic include:  1. Neurontin 300 mg at night.  2. Norco 5/325 not more than 90 per month.  3. Naprosyn p.r.n. up to b.i.d. which he takes not more than 20 per      month.  4. Effexor, he is currently taking 25 mg twice a day.  This is a      reduction from the month before.  He was on 75 once a day.  He did      not notice any worsening of his  anxiety with this slight reduction.  5. He continues also to take Prilosec.   PHYSICAL EXAMINATION:  VITAL SIGNS:  Blood pressure today is 129/82,  pulse 94, respirations 18, 99% saturated on room air.  He weighs 229  pounds which is an increase of 7 pounds from last month.  GENERAL:  He is well developed, mildly obese, middle-aged gentleman who  does not appear in any distress.  MENTAL STATUS:  He is oriented x3.  His speech is clear.  His affect is  bright.  He is alert, cooperative, and pleasant.  He follows commands  without any difficulty.  MUSCULOSKELETAL:  Transitioning from sitting to standing is done with  ease and no pain behaviors are appreciated.  Gait in the room is normal.  Tandem gait and Romberg tests are performed and area performed  adequately.  He is relatively normal forward flexion today and he has  about 10-15 degrees of extension in the lumbar spine.  He does complain  a bit with extension.  His reflexes in the lower extremities are  symmetric and intact.  Two  plus patellar and Achilles tendons.  Toes are  downgoing.  No clonus is noted.  No abnormal tone is noted.  No sensory  deficits are appreciated with light touch.  His motor strength is 5/5 at  the hip flexors, knee extensors, dorsiflexors, and plantar flexors.  Straight leg raise is negative.   IMPRESSION:  1. Chronic low back pain, status post laminectomy in 2001, Dr.      Lovell Sheehan.  2. Lumbar spondylosis/degenerative disk disease.  3. History of depression/anxiety.  Effexor was decreased from Effexor      XR 75 down to Effexor 25 mg twice a day without any problems.  4. History of insomnia.  This is currently not a problem for him.   PLAN:  1. We will continue him on Effexor 25 twice a day over the next month.  2. We will refill his Norco 5/325 up to 3 times a day and not more      than 90 tablets per month, this month.  3. We will see him back in a month.  4. He will continue to fill out his  activity diary for me, monitoring      how many minutes each day he is walking and also the number of      hydrocodone tablets he takes.   Mr. Schellinger continues to be a high functioning gentleman.  He reports  overall improvement with the medications he has been prescribed.  His  activity levels have increased.  I have not observed any aberrant  behavior with the use of medications from out clinic.  He will be  seeking a new primary care doctor.  Previous primary care doctor has  retired.  I will see him back in a month.  He is stable.           ______________________________  Brantley Stage, M.D.     DMK/MedQ  D:  09/25/2007 09:07:10  T:  09/25/2007 09:29:45  Job #:  [id number]

## 2010-10-20 NOTE — Op Note (Signed)
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   PROCEDURE:  1. Right shoulder arthroscopy, labral debridement.  2. Spinoglenoid notch cyst excision.  3. Suprascapular nerve decompression.   SURGEON:  Erasmo Leventhal, M.D.   ASSISTANT:  Leilani Able, PA-C.   ANESTHESIA:  Interscalene block with general.   BLOOD LOSS:  Less 50 mL.   COMPLICATIONS:  None.   DISPOSITION:  PACU stable.   DESCRIPTION OF PROCEDURE:  The patient and his family counseled in the  holding area.  The patient counseled in holding area and correct side  identified, IV started, antibiotics given, block administered.  He is  taken into the operating room.  Following administration of general  anesthesia, turned to the left lateral decubitus position, properly  padded and bumped.  Right shoulder examined.  Full range of motion  stable.  Prepped with DuraPrep and draped in the sterile fashion.  Overhead shoulder positioner was utilized to 30 degrees abduction, 10  degrees for flexion and 10 pounds longitudinal traction.  Posterior  portal was created. Arthroscope was placed into the glenohumeral joint.  Diagnostic arthroscopy revealed the following findings:  There was very  minimal undersurface tear of rotator cuff supraspinatus.  Biceps was  intact.  Articular cartilage was normal,  synovium capsule normal ,  glenohumeral ligaments normal.  However, labrum showed  degenerative  tearing  from approximately 9 o'clock on the glenoid to 3 o'clock.  Biceps anchor was stable.  It was degenerative type tear and two portals  created from outside-in technique.  __________ shaver was introduced.  It was debrided back to nice stable tissue and smoothed down with the  ArthroCare system.  I will also note at this point in time with  palpation, __________ type fluid could be seen expressed into the joint.  Shoulder  joint was copiously irrigated.  __________ was removed.   Incision was made posteriorly for posterior approach to the glenohumeral  joint.  There was incision made paralleling the border of the spinous  scapula to the edge of the acromion.  Dissection was to the skin  subcutaneous tissue.  The deltoid was taken off the scapula.  The end of  the infraspinatus could be seen transversely in its normal path.  At  this point time utilizing blunt dissection,  a good dissection was taken  lifting the infraspinatus off  the scapula, taking down to the  spinoglenoid notch.  There the cyst could be palpated after soft  dissection, confirmed by placing needle into the cyst, removing the  ganglion or cyst-type fluid.  Had completely benign appearance.  There  was no satellite lesions.  This was meticulously peeled off and  dissected.  A small capsulotomy was performed, confirmed this did go  into the joint to the edge of the labrum.   Following this, the suprascapular nerve and circumflex scapular artery  were palpated and found to be well decompressed, although there was  quite a bit of compression of the nerve and there was also denervation-  type changes of the  infraspinatus muscle itself.  At this point in  time, there were no other lesions noted.  The spinoglenoid notch well  decompressed, the ganglion removed.  The para labral cyst was removed  and the suprascapular nerve had been decompressed and left intact.  The  wounds were irrigated.  The deltoid  fascia was meticulously closed back  to the acromion with a Vicryl suture,  subcu Vicryl, skin with a  subcuticular Monocryl suture.  Portals closed with 4-0 nylon.  After  confirming anesthesia another 15 mL of 0.25%  Marcaine placed in the  wound edges.  He was  taken out of traction.  He had normal pulses in  the hand at the end of the case.  Otherwise noted at the time of  __________ excision.  Weight was removed from the traction on the arm.  He had excellent pulses in the hand and wrist.  Sterile dressings  applied, placed in a sling, turned supine and given a gram of Ancef  intravenously, awakened and taken from the  operating room to the PACU  in stable condition.  Sponge and count correct.  No complications or  problems.   Specimen was sent.  To help with surgical technique and decision-making,  Mr. Leilani Able PA-C's  assistance was needed throughout the entire  case.           ______________________________  Erasmo Leventhal, M.D.     RAC/MEDQ  D:  07/18/2008  T:  07/18/2008  Job:  [id number]

## 2010-10-20 NOTE — Assessment & Plan Note (Signed)
[facility]                            CARDIOLOGY OFFICE NOTE   NAME:Edward Wilkerson, Edward Wilkerson                      MRN:          [id number]  DATE:12/06/2007                            DOB:          Jun 10, 1950    The patient is a pleasant 60 year old male whom I am asked to evaluate  for an abnormal electrocardiogram and atypical chest pain.  The patient  apparently had a stress Cardiolite in November 2001 after being admitted  with chest pain.  There was no ischemia noted, and his ejection fraction  was 67%.  Of note, he typically does not have dyspnea on exertion,  orthopnea, PND, pedal edema, palpitations, presyncope, syncope, or  exertional chest pain.  Approximately 1 month ago, he developed an upper  respiratory infection associated with dyspnea and a cough.  During that  evaluation and treatment he had an electrocardiogram, which was felt to  be abnormal, and we were asked to further evaluate.  Of note, he  occasionally feels a tingling sensation in his chest by his report, but  there is no exertional chest pain.  There is no associated shortness of  breath, nausea, vomiting, or diaphoresis.  The symptoms do not radiate  nor are they pleuritic or positional.  They are not related to food.   MEDICATIONS:  1. Benicar of unknown dose.  2. Zocor 40 mg p.o. daily.  3. Effexor.  4. Neurontin.   ALLERGIES:  He has no known drug allergies.   SOCIAL HISTORY:  He does not smoke.  He consumes 2-3 alcoholic beverages  occasionally in the evenings.   FAMILY HISTORY:  His father had myocardial infarction and died, but it  was at age 81.   PAST MEDICAL HISTORY:  Significant for borderline diabetes mellitus by  his report.  He has hypertension and hyperlipidemia.  He has had  previous back surgery, and he has had chronic back pain.  He had a  previous hernia repair and appendectomy.  He has a history of depression  per the previous notes.   REVIEW OF SYSTEMS:   There is no headache at present.  There are no  fevers or chills.  He did have a productive cough approximately 1 month  ago, but this has resolved.  There is no hemoptysis.  There is no  dysphagia, odynophagia, melena, or hematochezia.  There is no dysuria or  hematuria.  There are no rashes or seizure activity.  There is no  orthopnea, PND, or pedal edema.  There is no claudication.  He does have  chronic low back pain.  The remaining systems are negative.   PHYSICAL EXAMINATION:  Today shows a blood pressure of 131/83, and the  pulse is 90.  He weighs 247 pounds.  He is well developed and somewhat  obese.  He is in no acute distress at present.  Skin is warm and dry.  He does not appear to be depressed.  There is no peripheral clubbing.  He has had previous lumbar surgery in his back.  HEENT:  Normal with normal eyelids.  NECK:  Supple with  a normal upstroke bilaterally.  No bruits noted.  There is no jugular venous distention, and I cannot appreciate  thyromegaly.  CHEST:  Clear to auscultation with normal expansion.  CARDIOVASCULAR:  Regular rate and rhythm.  Normal S1 and S2.  There are  no murmurs, rubs, or gallops noted.  ABDOMEN:  Nontender and nondistended.  Positive bowel sounds.  No  hepatosplenomegaly.  No masses appreciated.  There is no abdominal  bruit.  He has 2+ femoral pulses bilaterally.  No bruits.  EXTREMITIES:  No edema.  I can palpate no cords.  He has 2+ posterior  tibial pulses bilaterally.  NEUROLOGIC: Grossly intact.   His electrocardiogram shows a sinus rhythm at a rate of 82.  There is  right bundle-branch block.  A prior anterior infarct cannot be excluded.   DIAGNOSES:  1. Abnormal electrocardiogram - Mr. Mattox presents for evaluation of      his abnormal electrocardiogram, and there is a question of a prior      anterior infarct.  He has an occasional tingling sensation in his      chest that does not sound cardiac.  We will plan to proceed with  a      stress echocardiogram.  If it shows normal wall motion and no      ischemia then we will not pursue further evaluation.  2. Atypical chest pain - As per abnormal electrocardiogram.  Of note,      his symptoms are an occasional tingling, and I do not think this is      cardiac related.  3. Hypertension - His blood pressure is adequately controlled on his      present medications, and we will continue with his Benicar.  4. Hyperlipidemia - He will continue on his statin, and Dr. Drue Novel is      following this.  5. Borderline diabetes mellitus - Management per Dr. Drue Novel.   We will see him back on an as-needed basis pending the results of his  stress echocardiogram.     Madolyn Frieze. Jens Som, MD, [facility]  Electronically Signed    BSC/MedQ  DD: 12/06/2007  DT: 12/07/2007  Job #: [id number]   cc:   Willow Ora, MD

## 2010-10-20 NOTE — Assessment & Plan Note (Signed)
HISTORY:  Mr. Favata is a 60 year old married gentleman who works full  time as a Production designer, theatre/television/film.  He is back in today for a recheck and refill of his  medications.   He was last seen by me on November 20, 2007.   In the interim, he states he is followed up with Dr. Jens Som, a  cardiologist, who is seeing him for further workup regarding an abnormal  EKG result.   Mr. Abascal states he is scheduled for a stress echocardiogram.   Mr. Lohmeyer is being followed in our Pain and Rehabilitative Clinic for  chronic low back pain and intermittent bilateral leg pain.   His average pain is about 5 on a scale of 10; currently in the clinic is  about a 6.  He states his activity is interfered significantly by his  pain.  He continues to work full time.  Pain is typically worse with  standing and sitting, and improves with rest and medications.  He  reports good relief with current meds.   Pain is described as dull, aching; sometimes is more stabbing in nature  in the low back region, typically aggravating from his pain in the left  lower back area.   FUNCTIONAL STATUS:  Since his recent EKG abnormalities, he has not been  exercising.  He is able to climb stairs and drive.  He continues to  work.  He is independent with all of his self-care.  He admits to  occasional dizziness.  Denies depression, anxiety, or suicidal ideation.  Denies problems controlling bowel or bladder.  He has had some  respiratory infections, occasional shortness of breath, and wheezing,  and is currently followed by Dr. Drue Novel for these issues.   Past medical, social, and family history are as noted above.   Medications, which are prescribed by this clinic include:  1. Effexor 25 mg 1 p.o. everyday.  2. Norco 5/325 b.i.d. to t.i.d. not more than 90 tablets last month,      although he did call in for an early refill, and it was bridged      with 10 more tablets.   Occasionally takes Prilosec, and he is currently not using  Neurontin at  this time.   EXAM:  VITAL SIGNS:  Blood pressure is 124/72, pulse 86, respirations  18, and 97% saturate on room air.  GENERAL:  He is a well-developed, well-nourished gentleman, mildly  obese.  He is oriented x3.  Speech is clear.  His affect appears tired.  Today, he follows commands without difficulty.  Cranial nerves are  grossly intact.  NEUROLOGIC:  Coordination is intact.  Reflexes are symmetric and intact  in the lower extremities.  Sensation is intact.  Motor strength is 5/5  without any weakness.  Gait is symmetric and nonantalgic.  He has some limitations in forward  flexion; reports some pain, especially in the left lower back area.  Extension bothers him as well today.   Seated, internal and external rotation of the bilateral hip does not  increase his pain.  He has functional range of motion at both hips,  knees, as well as ankles without pain during range of motion.  He has  tenderness to palpation, especially in the left lumbar paraspinal  musculature; today he is quite tender.   IMPRESSION:  1. Lumbar spondylosis/degenerative disk disease.  2. Status post laminectomy in 2001, Dr. Lovell Sheehan, for chronic low back      pain.  3. History of depression and anxiety.  Overall, he has been doing      well; currently on a tapering dose of Effexor.   Mr. Scioneaux states he has had some significant stressors, however,  recently in his life including a daughter who is scheduled for induction  of her labor tomorrow, and Claris Gower, his wife, has her 50th birthday  coming up.  He feels he has been fairly busy at work, and he is  currently concerned about his abnormal EKGs.   He has been holding off on doing any kind of exercise until his cardiac  status is clarified with Dr. Jens Som and Dr. Drue Novel.   PLAN:  Today, Mr. Maxon has been taking 5/325 hydrocodone tablets on  the average per day.  We will go ahead and switch him to Ultram 3 to 4  tablets p.r.n. #120.  I  have encouraged him to use his TENS unit as well  as heat and ice to help manage his low back pain.  Once his cardiac  status is clarified, we will possibly need to contact Dr. Jens Som to  determine his limitations, if any as far as resuming his walking  exercise program.   I will see him back in 5 weeks.  He is currently stable.  He will let us  know how he is doing on the Ultram.           ______________________________  Brantley Stage, M.D.     DMK/MedQ  D:  12/20/2007 09:38:06  T:  12/21/2007 00:09:26  Job #:  [id number]

## 2010-10-20 NOTE — Assessment & Plan Note (Signed)
[facility]                        [place] OFFICE NOTE   NAME:Edward Wilkerson, Edward Wilkerson                      MRN:          [id number]  DATE:11/02/2006                            DOB:          07/25/1950    REASON FOR VISIT:  Follow up blood pressure.  Mr. Bontempo presents today  reporting that he is doing very well.  Has been taking his blood  pressure medication, i.e., Benicar hydrochlorothiazide 40/25 daily with  no side effects.  He has been trying to stay physically active.   Patient is also on Zocor 20 mg for hyperlipidemia.  He has not had any  significant side effects from the medication.  Denies any chest pain,  shortness of breath, or dyspnea on exertion.  He does have a mildly  elevated AST and ALT, which we have been monitoring for any increase.   MEDICATIONS:  1. Benicar hydrochlorothiazide 40/25.  2. Zocor 20 mg daily.  3. Neurontin 200 mg 3 daily.  4. Effexor 75 mg daily.   OBJECTIVE:  Weight 246.  Temperature 97.7.  Pulse 68.  Respiratory rate  18.  Blood pressure 110/74.  GENERAL:  We have a pleasant male, in no acute distress.  Answers  questions appropriately.  Overweight.  NECK:  Supple.  No lymphadenopathy, carotid bruits, or JVD.  LUNGS:  Clear.  HEART:  Regular rate and rhythm with normal S1 and S2.  No murmurs,  gallops, or rubs.  EXTREMITIES:  No cyanosis, clubbing, or edema.   IMPRESSION:  1. Hypertension, stable.  2. Hyperlipidemia.  3. History of hyperglycemia.   PLAN:  1. Advised patient to continue his current medications.  2. We will schedule patient for a 6-month followup for lab, which will      include a BMET, lipid profile, AST, ALT, and a hemoglobin A1c.  3. Patient is to follow up with me in 4 months, or sooner if need be.   ADDENDUM  Patient was also given a prescription for Xanax 0.5 mg 1 tablet 30  minutes before his scheduled  flight in a week.  Patient has history of anxiety when he flies.   Side  effects were reviewed.  He was provided 4 tablets with no refills.     Leanne Chang, M.D.  Electronically Signed    LA/MedQ  DD: 11/02/2006  DT: 11/02/2006  Job #: [id number]

## 2010-10-20 NOTE — Assessment & Plan Note (Signed)
REASON FOR EVALUATION:  Edward Wilkerson is a 60 year old married gentleman  who was last seen in our pain and rehabilitative clinic on January 19, 2007.   He is back in for a refill of his medications.  He has been seen in our  clinic for chronic low back pain.  He is status post laminectomy in 2001  by Dr. Lovell Sheehan, has chronic low back pain, known to have lumbar  spondylosis and degenerative disk changes.  He also has a history of  some mild depression and anxiety, for which he takes Effexor.   He states he has had a rough month over the last month.  He had an  injury to his right lower extremity with a pressure washer.  He has a  superficial gash over the right calf which he got while he was pressure-  washing his garage.   He is also upset by his son who is currently separated from his wife  apparently.   Edward Wilkerson states his average pain is about a 5 to 3 on a scale of 10.  Pain is described as stabbing; this is located especially in the left  low back area.  His pain is worse in the morning.  Sleep is fair.  He  gets good relief with the current medications that he is on.  His Norco  was decreased to 5/325 three times a day; however, with his pressure-  washing injury and stress, he did call in and request a bridge to the  next clinic and he was given 15 extra Vicodin.   He continues to work 40 hours as week.  He is independent with his self  care.   No changes in the past medical, social or family history.   REVIEW OF SYSTEMS:  No changes in review of systems, which is  noncontributory.   MEDICATIONS:  Medications prescribed by this clinic include:  1. Neurontin 300 mg one at night.  2. Norco 5/325 mg one p.o. t.i.d.   PHYSICAL EXAMINATION:  VITAL SIGNS:  Blood pressure today is 128/67,  pulse 87, respirations 18, saturated 97% on room air.  GENERAL:  He is a mildly obese, well-nourished and well-developed  gentleman who does not appear in any distress.  NEUROMUSCULAR:   He is oriented x3.  Speech is clear.  Affect is bright  and alert.  He is cooperative and pleasant.  He transitions from sitting  to standing easily.  Gait in the room is normal.  Tandem gait and  Romberg's test are performed adequately.  He displays excellent range of  motion with forward flexion and extension.  Seated reflexes are  symmetric and intact in the lower extremities.  Motor strength is 5/5.  No focal deficits are appreciated.  Normal tone is noted.  EXTREMITIES:  He does have a healing superficial laceration of the right  calf which measures approximately 16 cm in length.  No significant  erythema is noted.  No edema is noted.  No tracking is noted.   IMPRESSION:  1. Laceration to the right calf, status post pressure-washing injury.      I have asked him to follow up with Primary Care for observation if      it gets red and inflamed, he develops a fever or he gets swelling      in that leg.  2. Lumbago.  3. Status post laminectomy in 2001, Dr. Lovell Sheehan.  4. Lumbar spondylosis.  5. Degenerative disk disease.  6. History of  mild depression/anxiety.  7. History of insomnia, currently not a problem at this time.   PLAN:  We will refill the following medications for him:  1. Norco 5/325 one p.o. t.i.d. to q.i.d. p.r.n. back pain, #100 for      the month.  2. We will also give him Naprosyn 500 mg one p.o. b.i.d., #20, to take      on a p.r.n. basis for flareups.  3. He will continue with the Neurontin as well.   I spent 15 minutes discussing a flare-up protocols and coping strategies  for his back pain to avoid taking narcotics initially, looking into  using modalities and pacing his activities and using proper body  mechanics to decrease any kind of back pain.  Goals over the next few  months after the holidays are to decrease his Norco to twice a day and  then transition him to Ultracet or Tramadol.  He is in agreement with  this plan.   He has not displayed any aberrant  behavior.  He did have a flare-up and  required some additional medication this month; however, he was  decreased from 120 to 90 pills as well.  We will see him back in a  month.           ______________________________  Brantley Stage, M.D.     DMK/MedQ  D:  02/15/2007 12:05:51  T:  02/16/2007 07:29:56  Job #:  [id number]

## 2010-10-20 NOTE — Assessment & Plan Note (Signed)
Edward Wilkerson is a 60 year old married gentleman who was last seen by me  in March 25, 2008.  He is back in today for brief recheck.  He states  he does not need any medication refills.   Through this clinic, he has been taking Effexor 25 mg every other day as  well as Ultram 50 mg, 1-2 tablets in the morning, 1 at noon, and 1 at  night.   He states that in the interim, he has developed a cyst in his right  shoulder, followed up with Dr. Thomasena Edis, MD, for evaluation.  He  underwent MRI as well as electrodiagnostic testing by Dr. Ethelene Hal.   On February 11, he underwent right shoulder arthroscopic labral  debridement, spinoglenoid notch cyst excision, as well as suprascapular  nerve decompression.   He is currently in a sling and under the care of Dr. Valma Cava  postoperatively.  He is about 3 weeks status post this surgery.   He states his average pain is about 5 on a scale of 10.  He is currently  taking hydrocodone per Dr. Thomasena Edis up to 4 times a day at this time.   He also continues to take his Effexor 25 mg every other day.   Over the last several months, Edward Wilkerson states he has not been engaged  in his walking program as he had been earlier last fall.   Average pain is about 5 on a scale of 10.  Sleep is fair.  Pain is worse  with sitting, improves with rest.   MEDICATIONS:  He reports good relief with current meds per Dr. Thomasena Edis.   FUNCTIONAL STATUS:  Edward Wilkerson notes decline in his ability to perform  upper and lower extremity dressing given the current status of his right  upper extremity in a sling.  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  He had been off for about 2 weeks after surgery.  Apparently rehabilitation of the right shoulder is anticipated   No other changes regarding review of systems, past medical, social or  family history, otherwise unchanged.  Edward Wilkerson reports his blood  pressure and diabetes are well controlled at  this time.   Exam today; blood pressure is 128/68, pulse 90, respiration 18, and 95%  saturated on room air.  He is an obese middle-aged gentleman who appears  his stated age.   He is oriented x3.  Speech is clear.  Affect is bright.  He is alert,  cooperative, and pleasant.  Follows commands without difficulty.  Answers questions appropriately.   Cranial nerves and coordination are grossly intact.  Right upper  extremity is not evaluated today secondary to recent surgery and  currently in a sling.   He has mild limitations in lumbar motion, in forward flexion, and  extension with respect to his lumbar spine.  His reflexes are symmetric  and intact in the lower extremities.  No abnormal tone is noted.  No  clonus is noted.  No sensory deficits are appreciated.   His tandem gait and Romberg test are all performed adequately.   IMPRESSION:  1. Status post right shoulder arthroscopic labral debridement,      spinoglenoid notch cyst excision, and suprascapular nerve      decompression, Dr. Valma Cava, July 18, 2008, currently      under postoperative care by Dr. Thomasena Edis.  2. Lumbar spondylosis/degenerative disk disease.  3. Status post laminectomy in 2001 by Dr.  Lovell Sheehan and has continued      chronic low back pain.  4. History of depression and anxiety, currently on Effexor 25 mg every      other day.   PLAN:  Recommend follow up with Dr. Thomasena Edis as planned.  I continued to  encourage him to walk at least 3 times a week currently.  He was given  an activity log to help with compliance with respect to this.  He does  not need refills on any of his medications at this time.  He is not  taking Ultram, while he is taking hydrocodone per Dr. Thomasena Edis.  We will  see him back in 3 months.           ______________________________  Brantley Stage, M.D.     DMK/MedQ  D:  08/07/2008 09:40:19  T:  08/07/2008 21:46:47  Job #:  [id number]   cc:   Erasmo Leventhal, M.D.   Fax: [phone]

## 2010-10-20 NOTE — Assessment & Plan Note (Signed)
Edward Wilkerson is a 60 year old married gentleman who was last seen by me  on August 07, 2008.  He is back in today for brief recheck.  He does not  need any medications.   He does continue to use Effexor 25 mg twice a day.   His chief complaints are some new neck pain which radiates to the left  upper extremity, continue low back pain.   He is status post arthroscopic labral debridement of his right shoulder  by Dr. Valma Cava, February 11.  He states he is still doing  rehabilitation exercises for this right shoulder.   Edward Wilkerson's pain interferes very little with his general activities.  Sleep is fair.  He will get good relief with current medications which  are prescribed per Dr. Thomasena Edis.  He is on Darvon on 1 tablet up to 4  times a day.   Functional status, he is able to walk about 60 minutes at a time, able  to climb stairs, and drive.  He is independent with self-care works as  well as higher level household tasks including outdoor lawn care.   He works 40 hours a week.  He works as a Production designer, theatre/television/film in a sedentary job.   He states that his neck flared up after he had been doing some  activities outside which included trimming hedges and using a blower,  pain is not severe more of a low-grade ache in the neck and radiates to  the left triceps area.  No numbness or tingling associated with it.  He  does have a prior history of left ulnar nerve irritation with residual  numbness in the left upper extremity ulnar distribution.   REVIEW OF SYSTEMS:  Positive for recent the emergency room visit on  September 28, 2008.  His wife took him to the emergency room for dizziness  which occurred after he had been working outside for a while.  Head CT  without contrast was showed no intracranial abnormalities, etiology was  not entirely clear.  He does have an appointment Dr. Willow Ora, his  primary care physician next week.   Medications prescribed by this clinic Effexor 25 mg twice a day.   Exam, blood pressure 143/73, pulse 80, respirations 18, and 96%  saturated on room air.  He is obese gentleman who does not appear in any  distress.  He is oriented x3.  Speech is clear.  He appears somewhat  anxious, follows commands without difficulty, answers questions  appropriately.   Cranial nerves coordination are intact.  His reflexes are 2+ at the  biceps, triceps, brachioradialis, 2+ at the patellar and Achilles  tendons without abnormal tone clonus or tremors.   Normal muscle bulk is appreciated in upper and lower extremities.   Sensation is slightly decreased in the left ulnar distribution.  Motor  strength, however, is good throughout.  Straight leg raise is negative.   He has slightly decreased range of motion with rotation to the left  lacking about 10 degrees compared to the right, otherwise cervical range  of motion is within normal limits.  He has good range of motion both  shoulders with abduction, internal and external rotation.  On the right,  he has 90 degrees of external rotation and 70 degrees of internal  rotation.  Actively, he has full range of motion with abduction.   Lumbar motion is mildly limited.   Tandem gait and Romberg test are all performed adequately.  He has a  normal  gait as well.   IMPRESSION:  1. Status post right shoulder arthroscopic labral debridement, Dr.      Valma Cava on July 18, 2008.  Currently, under postop care      by Dr. Thomasena Edis and has been undergoing physical therapy.  2. History of lumbar spondylosis/degenerative disk disease.  3. Status post laminectomy in 2001, Dr. Lovell Sheehan, chronic low back      pain.  4. History of depression and anxiety, currently on Effexor 25 mg every      other day.  5. New posterior neck pain with radiation to the left triceps.  No new      sensory deficits in the upper extremity are appreciated.  He has a      minimal amount of rotation to the left compared to the right.   PLAN:   Naprosyn 500 one p.o. b.i.d. p.r.n. for neck pain if it becomes  problematic at this time.  He states that his neck pain is not limiting  with activity at this time and he does not really need any new  medications.   He is currently not taking any Ultram from this clinic.  He had been  prescribed previously, but he is currently on Darvon per Dr. Thomasena Edis.   He brought in his activity sheet, on the average, he is walking about 10  days per month, 30-60 minutes at a time.  We will see him back in 3  months.  I have encouraged him to continue his exercise program of  walking on a daily basis.  We will see him back in 3 months.           ______________________________  Brantley Stage, M.D.     DMK/MedQ  D:  10/02/2008 09:19:48  T:  10/02/2008 09:81:19  Job #:  [id number]

## 2010-10-20 NOTE — Assessment & Plan Note (Signed)
HISTORY:  [name] is a 60 year old gentleman who is being seen in  our pain and rehabilitative clinic for chronic low back pain status post  laminectomy back in 2001 with chronic degenerative disk disease.  He  also has a history of anxiety and depression.   Overall he has been doing quite well functionally.  Is interested in  weaning off of his Effexor.  He is back in today and brings an activity  diary.  He basically has been able to do some walking every 17 days out  of the last 30 days between 20 and 30 minutes.   He states his average pain is about a 3 on a scale of 10 interfering  very little with general activity.  Pain is typically worse with  sitting, improves with medications, currently getting good relief with  the meds prescribed by our clinic.   MEDICATIONS:  1. Medications from this clinic include Neurontin 300 mg at night.  2. Naprosyn 500 mg up to twice a day, not more than 20 per month.  3. Effexor XR 75 mg one p.o. q. a.m.  4. Prilosec 20 mg daily.  5. Norco 5/325 between 3 and 4 times a day, not more than 100 tablets      per month.   Last month a prescription was written for Effexor 25 mg two tablets a  day for 10 days then 2 tablets a day, however, he did not get this  filled.  He instead took his 75 XR Effexor every other day.   He is currently able to walk at least 60 minutes at a time.  He is able  to climb stairs and drive.  He is working 40 hours a week.  Denies  depression, anxiety.  Denies suicidal ideation.  Denies problems  controlling bowel or bladder.   REVIEW OF SYSTEMS:  Otherwise negative.   PAST MEDICAL HISTORY:  Remarkable for recent upper respiratory infection  including cough and was on some antibiotics earlier in the month.   SOCIAL HISTORY:  Unchanged.   FAMILY HISTORY:  Unchanged.   PHYSICAL EXAMINATION:  VITAL SIGNS:  Blood pressure is 122/82, pulse 95,  respirations 18, 98% saturated on room air.  His weight is 222 pounds.  GENERAL:  He is a well-developed, mildly overweight gentleman who  appears his stated age.  He is oriented x3.  Speech is clear.  His  affect is bright.  He is alert, cooperative and pleasant.  He follows  commands without difficulty.  MUSCULOSKELETAL:  Transitioning from sitting to standing is done quite  quickly and without any pain behavior.  Gait in the room is normal.  Tandem gait, Romberg tests are performed adequately.  Mild limitations  are noted in forward flexion.  He has preserved lumbar extension.  Reflexes are 2+ of patellar and Achilles tendon.  No sensory deficits  are appreciated.  He has normal tone and good strength in the lower  extremities.  Straight leg raise negative.   IMPRESSION:  1. Chronic low back pain status post laminectomy 2001 by Dr. Lovell Sheehan.  2. Lumbar spondylosis/depression anxiety, overall this has been      improved and the patient is currently interested in weaning down      from his Effexor.  3. History of insomnia, currently not a problem.   PLAN:  The patient will be given another activity diary.  He has been  able to exercise by walking briskly 17 out of 30 days in the  last month  between 20 and 30 minutes at a time.  His goal at this time will be to  walk at least that much again between 30 and 40 minutes at a time.  We  will check weight next month as well.  He will start his Effexor 25  t.i.d. for 10 days followed by b.i.d.  We will see him back in a month.  We will refill his Norco as well.  He has been stable on the above  medications.  He has not exhibited any aberrant behavior.  He was  instructed to follow the instructions on his prescription bottles.           ______________________________  Brantley Stage, M.D.     DMK/MedQ  D:  08/30/2007 14:16:53  T:  08/30/2007 16:10:96  Job #:  [id number]

## 2010-10-20 NOTE — Assessment & Plan Note (Signed)
Edward Wilkerson is a 60 year old married gentleman who has chronic low back  pain and intermittent right leg pain.  He is back in today for refill of  his medications.  He is status post laminectomy by Dr. Lovell Sheehan of the  lumbar spine back in 2001.  He had chronic low back pain since then.  His average pain is about 7 on a scale of 10.  Today, he states that he  has recently done a lot of [place].  He has increased amount of pain in his low back when he has to sit for a  long period of time.   He has apparently been discharged by Dr. Thomasena Edis for recent shoulder  surgery and overall he been doing well with that.  He reports some  urinary symptoms today, then I request he follow back up with Dr.  Gerri Spore.   Pain is greatest in the low back.  He has some achiness in the right hip  as well and some pain into the right calf intermittently.  Pain is worse  with bending, sitting, standing, improves with medication, and reports  good relief with current meds.   FUNCTIONAL STATUS:  He is able to walk about 15-20 minutes.  He states  his pain basically comes and goes.  He continues to work 40 hours a  week, independent with self-care.   REVIEW OF SYSTEMS:  Otherwise negative other than new bladder symptoms,  which he will follow up with Dr. Gerri Spore.   No other change in past medical, social, and family history.   PHYSICAL EXAMINATION:  VITAL SIGNS:  Blood pressure 105/80, pulse 102,  respirations 18, and 98% saturated on room air.  GENERAL:  He is a well-developed, obese gentleman, who appears his  stated age, oriented x3.  Speech is clear.  Affect is bright.  He is  alert, cooperative, and pleasant.  Follows commands without difficulty  and answers questions appropriately.  NEUROLOGIC:  Cranial nerves and coordination are intact.  Sensation is  intact in bilateral lower extremities.  Motor strength is 5/5.  Reflexes  are symmetric and intact.  Straight leg  raise is negative.   Transitioning from sitting to standing is done with ease.  Gait in the  room is not antalgic.  Tandem gait.  Romberg test performed adequately.   Thomas test reveals tight hip flexors bilaterally.  He has tenderness  over the right trochanter as well.   IMPRESSION:  1. Low back pain.  2. History of lumbar spondylosis/degenerative disk disease.  3. Status post laminectomy in 2001, Dr. Lovell Sheehan.  4. Status post right shoulder arthroscopic labral debridement, Dr.      Valma Cava on July 18, 2008.   PLAN:  We will restart tramadol 50 mg 1 p.o. up to 4 times a day,  Naprosyn 500 one p.o. b.i.d. #20, and Neurontin 300 mg 1 p.o. nightly,  titrating up to 3 times a day.  Recommend physical therapy again  continuing with lumbar stabilization program, moving toward aerobic  conditioning to maintain appropriate body weight.  We will see him  back in a month.  [name] have answered all questions.  She appears  to be satisfied with treatment.  We will see him back.           ______________________________  Brantley Stage, M.D.     DMK/MedQ  D:  12/16/2008 14:24:31  T:  12/17/2008 03:18:36  Job #:  [id number]

## 2010-10-20 NOTE — Assessment & Plan Note (Signed)
[name] is a 60 year old gentleman who has chronic low back pain.  He is status post laminectomy back in 2001 by Dr. Lovell Sheehan, has had  chronic low back pain since then.  His average pain is about 3 a scale  of 10, however, he had an exacerbation earlier this week after he had  driven 2 hours to [place], sat for 2 more hours for a show and then  drove back again another 2 hours the day before that he had been on a  riding lawn mower for at least an hour.  Two days after he did all of  riding, he woke up with low back pain.  Denies any trauma.  Denies any  new numbness, tingling, weakness, bowel, or bladder problems.  Denies  any new problems with walking, but does complain of significant low back  pain which is localized to the left side of his low back.  Overall in  the last couple of days, he has been improving.  He lost 1 day of work  due to the pain which was on May 25. 2010.   Pain typically is worse with any prolonged position.  He is varying  positions frequently at this point.   Functional status, he walk about 10 minutes at a time.  He can climb  stairs.  He is driving.  He is working 40 hours a week.  He is  independent with self care.  Denies any new problems with respect to  bowel, bladder, numbness, tingling, weakness, or walking.  Denies  suicidal ideation.   No new changes with respect to review of systems.   Medications provided through this clinic include:  1. Effexor 25 mg once every other day.  2. Ultram 50 mg 1 p.o. q.i.d. p.r.n.   Denies any new changes with respect to past medical, social, or family  history.  He does continue to participate in physical therapy program as  he has recently right shoulder surgery, arthroscopic surgery by Dr.  Thomasena Edis on July 18, 2008.  He still finishing up physical therapy  rehabilitation program for the shoulder.   On exam today, his blood pressure is 110/65, pulse 81, respirations 18,  and 98% saturated on room  air.  He is well-developed, obese gentleman who appears somewhat uncomfortable  today and tired.  He is oriented x3.  Speech is clear.  His affect is  overall bright, but tired.  He answers my questions appropriately and is  able to follows commands without difficulty.   Cranial nerves are unchanged and intact grossly.  Coordination is  intact.  Reflexes are 2+ at the patellar tendons, 2+ at the Achilles  tendon.  There is no abnormal tone.  No clonus is noted.  No tremors are  appreciated.  Straight leg raise is negative.   He has some mild numbness to pinprick on the left lateral calf and over  the dorsum of his right foot.   He has excellent strength, however, 5/5 at hip flexors, knee extensors,  dorsiflexors, plantar flexors, and EHL.   Transitioning from sitting to standing without difficulty, but slowly  gait in the room is even nonantalgic.  Tandem gait and Romberg test are  performed adequately.   He has limited forward flexion as well as extension today with respect  to lumbar spine.  Complains of pain especially in the left lumbar area,  low lumbar area with movement.  He has some mild tenderness in the left  lumbar paraspinal musculature.  IMPRESSION:  1. New exacerbation of low back pain, status post significant driving      over the last weekend.  2. History of lumbar spondylosis/degenerative disk disease.  3. Status post laminectomy in 2001 by Dr. Lovell Sheehan, chronic low back      pain.  4. Status post right shoulder arthroscopic labral debridement by Dr.      Valma Cava on July 18, 2008.  Currently, undergoing      physical therapy.   PLAN:  We will have him seen in physical therapy to start some  modalities to the lumbar spine, education on proper body mechanics and  posture and begin lumbar stabilization program, progressing toward home  exercise program.  We will prescribe the following medications form  today as well, Naprosyn 500 mg 1 p.o. b.i.d. #20,  Prilosec 20 mg 1 p.o.  daily, #30, Norco 5/325 mg 1 p.o. t.i.d. p.r.n. back pain, #30, and  Flexeril 10 mg 1 p.o. nightly p.r.n. back spasm.  We will see him back  within the next 2 months, prescription for physical therapy was written  as well.  I answered all questions.           ______________________________  Brantley Stage, M.D.     DMK/MedQ  D:  11/01/2008 13:04:32  T:  11/02/2008 02:30:35  Job #:  [id number]

## 2010-10-20 NOTE — Assessment & Plan Note (Signed)
Mr. Micale as a 60 year old married gentleman who works full time.  He  was last seen by me on December 16, 2008.  He is status post laminectomy by  Dr. Lovell Sheehan, back in 2001.  He is being followed in our [facility] for chronic low back pain and occasional lower  extremity discomfort.   His average pain is about 5 on a scale of 10, currently that is 2 in the  clinic today.  He describes the pain when he gets it as stabbing.  It  typically comes on after he has been sitting for quite a while  especially with long car ride.  Currently, his pain interferes very  little with his activity level.  He is working 60 hours a week.  Pain is  typically worse in the morning, quite a bit of stiffness, improves as  the day goes on.  Sleep is fair.  Pain improves with rest and  medications.  He reports good relief with current meds.   Medications prescribed through our clinic include p.r.n. Naprosyn not  more than 20 tablets per month, Neurontin 300 mg up to 3 times a day,  and tramadol between 2-4 times a day.   Functional status, he is able to walk at least 60 minutes at a time,  climb stairs, and drive without difficulties.  Independent with self-  care and works 40-50 hours a week.   Review of systems is negative.  Indicates no problems in these areas.   Past medical, social, and family history unchanged.   Exam, blood pressure 125/74, pulse 83, respirations 18, 94% saturated on  room air.  He is a well-developed, well-nourished gentleman with some  truncal obesity.   Oriented x3.  Speech is clear.  Affect is bright.  He is alert,  cooperative, and pleasant.  Follows commands without difficulty, and  answers questions appropriately.   Cranial nerves coordination are intact.  Reflexes are 2+ in upper and  lower extremities symmetric.  No abnormal tone clonus or tremors  appreciated.  Motor strength 5/5 without focal deficit.   Transitioning from sitting to standing is  done with ease.  Gait is not  antalgic.  Tandem gait and Romberg test are performed adequately.   Limitations noted in lumbar motion in all planes minimally; however,  today mild tenderness especially in the left with palpation in the  lumbar paraspinal musculature.   IMPRESSION:  1. Lumbago.  2. Status post laminectomy in 2001 Dr. Lovell Sheehan.  3. Lumbar spondylosis/degenerative disk disease.  4. Status post right shoulder arthroscopic labral debridement, Dr.      Valma Cava, July 18, 2008.   PLAN:  The patient does not need refills today.  He continues to take  Naprosyn on a p.r.n. basis not more than 20 tablets per month,  Neurontin 300 mg up 3 times a day, and tramadol 50 mg 2-4 times a day.   Mr. Miyoshi takes his medication as prescribed.  No evidence of aberrant  behavior is observed.  He is remaining highly functional despite the  chronic problems with his low back and has been stable on medications.  We will see him back in 3 months.  He will call us if he needs a refill  on his medications.           ______________________________  Brantley Stage, M.D.     DMK/MedQ  D:  01/10/2009 13:56:59  T:  01/11/2009 01:09:11  Job #:  [id number]

## 2010-10-20 NOTE — Assessment & Plan Note (Signed)
[name] is back in today for a refill of his pain medications to our  [facility].   He is being seen in our clinic for a variety of chronic pain complaints.  However, the predominant one today is mainly low back pain.  Occasionally he does have some variety of other problems.  Last visit he  had a tendinitis, which has currently resolved, in his right ankle.   He states today that he has been under some stress recently.  His sister  died last week after a prolonged illness, and a good friend of his also,  who was fairly young, died unexpectedly yesterday.  He has been quite  busy with work and will be flying in and out of the state over the next  couple of weeks.   His main complaint today is low back pain.  He has had to travel to  [place] for the funeral of his sister and has been on the road quite a  bit with respect to that.   His average pain is about a 6 on a scale of 10.  It is a little better  this morning in the clinic at about a 3.  The pain is typically in the  low back region described as aching in nature.  He also has a new region  just medial to the left scapula.   Pain is typically worse in the morning and daytime.  His sleep is fair  at night.  He states he is getting good relief with the current  medications provided by our clinic.  Medications provided by our clinic  include:  Neurontin 300 mg 1 p.o. at night.  Norco 5/325, not more than  100 per month, between 3-4 per day, Naprosyn on a p.r.n. basis.  He  currently is not using much of that over the last month.  He continues  to take Effexor XR 75 mg q.a.m. as well as Prilosec 20 mg daily.   He states there is no problems with any of these medications.  He is  tolerating them well.  He does have an allergy to Sulfa.   FUNCTIONAL STATUS:  Patient is able to walk 60 minutes at a time.  He is  able to climb stairs and drive.  He is working 40 hours a week.  He is  independent with all of his  self-care needs as well as higher level  activities.   Specifically reviewing neurologic and psychiatric profiles in light of  recent deaths of his sister and friend, he states that he is coping  fairly well.  Does not feel unusually depressed or anxious.  Denies any  kind of suicidal ideation and is not requesting any kind of intervention  with respect to that.   REVIEW OF SYSTEMS:  Otherwise negative.   PAST MEDICAL SOCIAL AND FAMILY HISTORY:  Unchanged from our last visit.  He was seen back in May 10, 2007.   PHYSICAL EXAMINATION:  On exam today, his blood pressure is 131/92,  pulse 92, respiration 18 and 97% saturated on room air.  He is a well-  developed, obese, overweight gentleman.  He does not appear in any  distress.  He is oriented x3.  His speech is clear.  His affect is  overall bright.  He is cooperative, pleasant and he follows commands  easily.   He transitions from sitting to standing without difficulty.  Gait in the  room is normal.  Tandem gait  and Romberg's test are also performed  adequately.  Coordination is grossly intact.   He has mild limitation in lumbar motion, especially with forward  flexion.  His cervical range of motion is adequate as well without  significant deficit.  He has full shoulder range of motion.  His  reflexes are symmetric and intact in upper and lower extremities.  There  is no abnormal tone.  No clonus is noted.  His motor strength is 5/5 in  the upper and lower extremities without focal deficit.  No sensory  deficits are appreciated today as well.   He has some areas of tenderness just medial to the left scapula.  Mild  tenderness in the lumbar perispinal musculature is noted as well   IMPRESSION:  1. Chronic low back pain status post laminectomy 2001, Dr. Lovell Sheehan,      history of lumbar spondylosis, degenerative disk disease.  2. Mild periscapular discomfort.  3. History of mild depression and anxiety.  4. History of  insomnia.  Currently not a problem at this time.   PLAN:  1. We will refill the following medications for Mr. Urbas today:      Norco 5/325 one p.o. t.i.d. to q.i.d. p.r.n. #100 per month, no      refills, and Effexor XR 75 mg 1 p.o. q.a.m. #90 with two refills.      I discussed ergonomics with him as well today regarding his      computer.  Apparently he is on a laptop quite a bit, which may be      giving him some trouble in the periscapular region.  He also has      been driving a recently, which also is probably contributing to his      periscapular discomfort.  We discussed ergonomics for about 10      minutes and modifications that can be made at his work site.   He has not displayed any aberrant behavior regarding his Norco.  He  takes it as prescribed.  He is not having any problems with oversedation  or constipation.  He is also tolerating his Effexor quite well.  He was  given a three moths' supply today.   1. We will see him back next month.  At some point we would like to      taper his Norco and transition him to Ultracet.  We will see him      back in a month.           ______________________________  Brantley Stage, M.D.     DMK/MedQ  D:  07/05/2007 11:12:58  T:  07/05/2007 14:36:34  Job #:  [id number]

## 2010-10-20 NOTE — Assessment & Plan Note (Signed)
HISTORY:  Edward Wilkerson is back in to our pain and rehabilitative clinic.  He states he has been traveling about a week, last week, but overall he  is doing fairly well.  He states he is getting a bit frustrated with his  Effexor.  He feels like he has been doing fairly well with respect to  his anxiety and pain.  He has been taking the 75 mg of the Effexor XR  for a couple of years now.  He notes when he misses a dose he does not  like the way he feels when he forgets to take the medication.  He is  wondering if he still really needs to be on this medicine.   When he was first placed on the medication he had some issues at work as  well as in his family life which had been quite stressful for him  including a very ill sister.  Several of these things have now resolved  for him and he feels that he might be able to do well without the  medication.   His average pain in the clinic today is about a 4 on a scale of 10.  Average during the month is about a 5.  He indicates that pain  moderately interferes with his activity level, very little with  relationships with others, and enjoyment of life.   Pain is typically worse in the morning.  Sleep tends to be fair.  Pain  is worse with certain activities but improves with rest and medications.   He is able to walk about 60 minutes at a time.  He is able to climb  stairs and drive.  He, however, states that currently he is out of his  routine of doing exercise in the last month or so.   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   REVIEW OF SYSTEMS:  No changes with respect to review of systems.  He  denies problems controlling bowel or bladder, denies depression,  anxiety, confusion.  Denies suicidal ideation.   PAST MEDICAL/SOCIAL/FAMILY HISTORY:  There are no reported changes.   MEDICATIONS:  Medications from this clinic include:  1. Neurontin 300 mg nightly.  2. Narco 5/325 mg one p.o. t.i.d. to q.i.d. #100 per  month.  3. Naprosyn 500 mg, not more than 20 per month.  4. Effexor XR 75 mg q.a.m.  5. Prilosec 20 mg daily.   PHYSICAL EXAMINATION:  VITAL SIGNS:  Blood pressure 131/83, pulse 95,  respirations 18, 97% saturated on room air.  GENERAL APPEARANCE:  He is a well-developed, well-nourished gentleman  who appears his stated age.  He does not appear in any distress.  He is  oriented x3.  His speech is clear.  His affect is bright.  He is alert.  He is cooperative and pleasant.  He follows commands without any  difficulty.   He transitions from sitting to standing easily without pain behaviors.  Gait in the room is normal; tandem gait, Romberg's test are all  performed adequately.  He has some limitations in lumbar motion, reports  some mild discomfort with end-range and forward flexion and extension.  Reflexes are symmetric and intact in the lower extremities.  Motor  strength is 5/5 without focal deficit.  No sensory deficits are  appreciated.  Straight leg raise is negative.   IMPRESSION:  1. Chronic low back pain status post laminectomy 2001, Dr. Lovell Sheehan.  2. Lumbar spondylosis/degenerative disc disease.  3. History of depression and anxiety.  Overall has been improved over      the last couple of months.  4. History of insomnia, currently not a problem.   PLAN:  According to Mr. Mcniel's wishes, we will slowly taper Effexor.  He is currently on 75 mg XR and we will switch his dose now to Effexor  25 mg one p.o. t.i.d. x10 days and then b.i.d., #90 given with one  refill.  I will also refill his Narco 5/325 one p.o. t.i.d. to q.i.d.  #100.  He does not need refills on any of his other medications at this  time.  We will see him back in one month.  He will let us know if he has  any trouble weaning down slowly from the Effexor.           ______________________________  Brantley Stage, M.D.     DMK/MedQ  D:  08/02/2007 13:16:13  T:  08/03/2007 09:37:19  Job #:  [id number]

## 2010-10-20 NOTE — Assessment & Plan Note (Signed)
HISTORY OF PRESENT ILLNESS:  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  He is returned to clinic today for  a brief recheck and refill of his medications.  He also brings in an  activity sheet which he filled out last month.  He states it was not as  good a month.  He has been out of his house for the furniture market.  He has not been able to exercise as much as he did the previous month.  He was able to exercise 16/28 days last month.  He has been averaging  about three hydrocodone per day.   He states his average pain is about a 3-4 on a scale of 10.  He is  getting good relief with the medication.  Pain is typically worse with  bending and prolonged standing or walking, improves with rest and  medication.   Pain is localized apparently through the lumbar regions without  radiation through the buttocks or into the lower extremities.  He is  able to walk up to 60 minutes at a time.   Denies any problems controlling bowel or bladder.  Denies numbness,  tingling or weakness.  Denies depression, anxiety or suicidal ideation.  There are no new changes in any of his medications or changes in his  past medical history from the last month.   SOCIAL/FAMILY HISTORY:  Unchanged.   MEDICATIONS PROVIDED BY THIS CLINIC:  1. Neurontin 300 mg at night.  2. Norco 5/325 #90 per month.  3. Naprosyn 500 up to twice a day but  not more than 20 tablets per      month.  4. Effexor 25 mg twice a day.  5. Prilosec.   PHYSICAL EXAMINATION:  VITAL SIGNS:  Blood pressure 141/78, pulse 78,  respirations 20, 97% saturation on room air.  His weight is 229 pounds  which is the same as last month.  GENERAL:  Mr. Ammon is a well-developed, well-nourished gentleman with  truncal obesity.  NEUROLOGICAL:  He is oriented times three.  Speech is clear.  Affect is  bright.  He is alert, cooperative and pleasant.  He follows commands  without difficulty.  He does not appear  in any distress.  He is able to  transition from sitting to standing quite easily and quickly.  His gait  in the room is normal.  He has some limitations in forward flexion as  well as extension.  He has no pain with these movements today.  Reflexes  are symmetric and intact in the lower extremities.  Motor strength is  5/5.  He has well preserved hip range of motion without pain.  There is  no abnormal tone noted.  No clonus noted.  His tandem gait and Romberg's  test are performed adequately.   IMPRESSION:  1. Lumbar spondylosis/degenerative disk disease.  2. Status post laminectomy 2001, Dr. Lovell Sheehan, with chronic low back      pain.  3. History of depression/anxiety.  Currently on Effexor.  He is      currently taking 25 mg b.i.d. without any problems.   PLAN:  Will continue to reduce his Effexor today from 25 mg b.i.d. to 25  mg b.i.d. and on every other day 25 mg once a day.  Will continue Norco  at 5/325 up to three times a day as his Effexor is weaned down.  Anticipate pushing him over to Tramadol if he is interested in pursuing  this as well.  He will continue to fill out an activity diarrhea.  Another one was given to him today.  He is also going to attempt to seek  a new primary care doctor.  He is thinking about Dr. Drue Novel and is planning  to schedule an appointment.  We will see him back in a month.  He has  been  stable on the above medications.  He does not exhibit any aberrant  behavior.  He takes his medications as prescribed.  He is a high  functioning individual working 40 hours a week and also engaging in  exercise program.           ______________________________  Brantley Stage, M.D.     DMK/MedQ  D:  10/23/2007 09:34:12  T:  10/23/2007 10:01:14  Job #:  [id number]

## 2010-10-20 NOTE — Assessment & Plan Note (Signed)
Edward Wilkerson is a 60 year old married gentleman, who is being followed in  our pain and rehabilitative clinic, apparently for lumbago.  He is  status post laminectomy 2001, and has a lumbar spondylosis, degenerative  disc disease.   He is back in today for refill of his medications.  Overall, he has been  feeling better.  He has stayed active.  He is working 40 hours a week.  He occasionally takes care of a five and two-year-old grandchild.  His  average pain is about a 5 on a scale of 10.  Currently, in clinic, it is  about a 3.  His sleep is fair.   He reports good relief with the current meds that he is on.  He states  his worst time of day, however, is in the morning. Pain is described as  stabbing when he gets it and it is intermittent.   He is able to walk about 60 minutes at a time.  He is independent with  all of his self-care.   No changes in his past medical, social or family history since last  visit.   MEDICATIONS:  Medications prescribed by this clinic include:   1. Neurontin 300 mg one p.o. q.h.s. #30 per month.  2. Norco 5/325 one p.o. t.i.d. to q.i.d. #100 per month.  3. He takes Naprosyn only for flare-ups for 7-10 days at a time on a      p.r.n. basis.  He has currently not been on any for the last month.   EXAM TODAY:  His blood pressure is 133/62, pulse 92, respirations 18,  95% saturated on room air.  He is a well-developed, obese gentleman, who appears his stated age.  He  is oriented times three.  Speech is clear.  Affect is bright, alert,  cooperative and pleasant.  He transitions from sitting to standing quite easily.  Gait in the room  is normal.  Narrow base of support.  Normal arm swing.  Range of motion is mildly limited in forward flexion, extension is quite  good.  Reflexes are symmetric and intact in the lower extremities.  Motor strength is 5/5, hip flexors, knee extensors, dorsiflexors,  plantar flexors, as well as EHL.  No abnormal tone is  noted.  No clonus is noted.   IMPRESSION:  1. Lumbago.  2. Status post laminectomy, 2001, Dr. Lovell Sheehan.  3. Lumbar spondylosis.  4. Degenerative disc disease.  5. History of mild depression and anxiety.  6. History of insomnia.  However, currently not a problem at this      time.   PLAN:  We will refill his Norco for him, 5/325 one p.o. t.i.d. to q.i.d.  #100.  Would eventually like to trial him on Tramadol.  We will continue  his Neurontin 300 mg one p.o. q.h.s.  We will also add 600 mg of  ibuprofen in the morning only and he will take some Prilosec, as well,  when he takes his medications.  We will see him back in two  months.  Nursing visit next month for refill of medications.  He has  been stable on these medications.  He stays very active and uses his  medications appropriately.  No aberrant behavior has been noted.           ______________________________  Brantley Stage, M.D.     DMK/MedQ  D:  03/15/2007 13:10:21  T:  03/15/2007 22:33:12  Job #:  [id number]

## 2010-10-21 ENCOUNTER — Other Ambulatory Visit: Payer: Self-pay

## 2010-10-21 ENCOUNTER — Other Ambulatory Visit: Payer: Self-pay | Admitting: Internal Medicine

## 2010-10-21 MED ORDER — HYDROCODONE-ACETAMINOPHEN 7.5-750 MG PO TABS: 1.0000 | ORAL_TABLET | Freq: Four times a day (QID) | ORAL | Status: DC | PRN

## 2010-10-21 NOTE — Telephone Encounter (Signed)
Requesting a refill

## 2010-10-21 NOTE — Telephone Encounter (Signed)
Pt would like to have refill on vicodin hasn't been to see Dr. Ethelene Hal yet was trying to get records sent to another Dr. for second opinion.

## 2010-10-21 NOTE — Telephone Encounter (Signed)
Per Chemira it was sent in, duplicate files.

## 2010-10-23 NOTE — Assessment & Plan Note (Signed)
Edward Wilkerson is a 60 year old married gentleman who is being followed in  our pain and rehab clinic for chronic low back pain.  He is status post  a laminectomy by Dr. Lovell Sheehan in 2001 and has had intermittent flare-ups  of known lumbar degenerative disk disease and lumbar spondylosis.   He is back in today for a refill of his medications.  His average pain  is about a 3 on a scale of 10.  Worse in the morning.  Sleep is fair.  He gets good relief with the meds that he is on.  He is a high-  functioning individual, working 40+ hours a week, independent with all  of his self-care as well.  No new changes in past medical, social, or  family history since his last visit.   He states he is interested, however, in starting to play some softball  again in the spring, which will involve some short sprints.  He has not  done any running at this point.  He is, however, working out at [employer]  doing some walking.   MEDICATIONS:  1. Neurontin 300 mg 3 times a day.  2. Effexor 75 mg 1 p.o. q. a.m.  3. Prilosec 20 mg daily.  4. Norco 5/325 up to 5 times per day.  5. Naprosyn on a p.r.n. basis.   EXAM:  Blood pressure is 134/67, pulse 95, respirations 16, 97%  saturation on room air.  He is a well-developed, mildly obese gentleman who appears his stated  age.  Does not appear in any distress.  He is oriented x3.  His affect  is bright and alert.  He is cooperative and pleasant, and he follows  commands without any difficulty.   He transitions from sit to stand independently.  Gait is not antalgic.  He has good motion with forward flexion and extension today.  Lateral  flexion is also adequate.   Seated, his reflexes are symmetric and intact to the lower extremities.  Motor strength is 5/5.  Straight leg raise is negative.  No abnormal  tone is noted.   No sensory deficits are appreciated.   IMPRESSION:  1. Lumbago.  2. Status post laminectomy in 2001 by Dr. Lovell Sheehan.  3. Lumbar  spondylosis/degenerative disk disease.  4. Mild depression/anxiety.  5. History of insomnia.   PLAN:  Refill Norco 5/325 one p.o. q.i.d. and at bedtime #150.  No  refills.  Encouraged gradual increase in activity in anticipation of  playing softball in the spring.  We discussed various common lower  extremity injuries that are associated with sprinting, including plantar  fasciitis and Achilles tendinitis, hamstring pulls.   I do caution him against these things and that, to be prepared for  softball, he would gradually need to increase his activity.  He  understands.  He would like to pursue his activities none the less.   He reports that he is having a little bit of difficulty on the weekends  after playing with the grand kids.  I recommend lumbar support during  these types of activity as well.  He is overall doing well on the  medications.  He is a high-  functioning individual.  Does not display any aberrant behavior at this  point and I have a nursing visit for him in approximately 1 month, and I  will see him back in 2 months.           ______________________________  Brantley Stage, M.D.  DMK/MedQ  D:  08/03/2006 12:27:32  T:  08/03/2006 12:54:17  Job #:  [id number]

## 2010-10-23 NOTE — Assessment & Plan Note (Signed)
MEDICAL RECORD NUMBER:  [id number].   Edward Wilkerson is a 60 year old gentleman who is being seen in our pain and  rehabilitative clinic for chronic low back pain secondary to degenerative  disk disease at L4-5 and L3-4 and facet arthropathy at multiple levels. He  also has underlying anxiety.   He is back in today and states over the last month he has had a rough time.  He has been ill with some gastroenteritis, and his wife has been out of town  overseas.   He tells me he has essentially stopped working out for the last four weeks  or so and is frustrated with himself and apparent weight gain that he has  experienced.   His average pain is about a 6 on a scale of 10. Sleep is fair. He reports  good relief with the current medications that he is on. Pain continues in  the low back, nonradiating, worse with activity. He is able to walk about 60  minutes at a time. He is able to drive and climb stairs. 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 He is, however, considering joining a gym.   REVIEW OF SYSTEMS:  Negative.   Past medical, social and family history unchanged since our last visit.   PHYSICAL EXAMINATION:  VITAL SIGNS:  Blood pressure 122/77, pulse 111,  respirations 16, 96% saturated on room air.  GENERAL:  Well-developed, well-nourished gentleman does not appear in any  distress. He appears his stated age. He is oriented x3. His affect is  bright, alert, cooperative and pleasant although somewhat anxious which is  not new for him. He is able to stand easily after being seated. His gait is  nonantalgic. He has normal stride length, normal heel/toe mechanics. His  lumbar range is good with forward flexion. He reports some pain with  extension and lateral flexion. Seated, his reflexes are symmetric and intact  in the lower extremities. No focal weakness is noted. Straight leg raise is  negative. No abnormal tone is noted.   IMPRESSION:  1.  Lumbar spondylosis.  2.  Mild  depression/anxiety.  3.  History of insomnia.   PLAN:  1.  Refill the following medications for him:  Lidoderm 1 to 3 patches 12      hours on and 12 hours off #90, 3 refills.  2.  Norco 5/325 one p.o. q.i.d. and h.s. p.r.n. back pain #150, no refills.  3.  Neurontin 300 mg 1 p.o. nightly #31, 3 refills.   Again, I spent 25 minutes discussing the importance of maintaining his  activity level. I encouraged him to start his walking program again and get  involved in a regular exercise program. He states he will comply and try to  keep a journal of his activities. We will see him back in a month.          ______________________________  Brantley Stage, M.D.    DMK/MedQ  D:  06/30/2005 13:31:35  T:  06/30/2005 16:14:15  Job #:  [id number]

## 2010-10-23 NOTE — Assessment & Plan Note (Signed)
Mr. Mckesson was last seen by me in February 2007. He has had in the interim  two nursing visits. He states that he is back in today for a refill of his  medications. He has been seen in our pain and rehabilitative clinic for  chronic low back pain, status post micro diskectomy in 2001 by Dr. Lovell Sheehan.   He is also being treated for mild depression/anxiety. He also has a history  of some insomnia.   His average pain is 4 on a scale of 10. It interferes very little with his  general activity. Sleep is fair. He gets good relief with the current  medications that he is on. Pain is worse with sitting, improves with rest,  therapy, exercise, medication. It continues to be localized at the low back  region.   He reports very good relief with his current medications. He is walking  about 60 minutes a day for two to three times a week. He is able to climb to  stairs. He is driving. He is working 40 hours a week.   He denies any problems with over sedation, constipation with this  medication. He continues to stay a highly functional individual.   PHYSICAL EXAMINATION:  Blood pressure 146/85, pulse 90, respirations 17, 97%  saturation on room air. Well-developed, well-nourished gentleman who appears  his stated age. He is oriented times three. Affect is bright, alert,  cooperative, and pleasant. He seems much less anxiety and irritable. His  gait is normal. He transitions from sit to stand without difficulty. Tandem  gait is normal. Romberg testing is negative. Reflexes are symmetric and  intact in the lower extremities. There is no focal weakness noted.  No new  sensory deficits are noted.   IMPRESSION:  1.  Lumbago.  2.  Status post laminectomy in 2001, Dr. Lovell Sheehan.  3.  Lumbar spondylosis.  4.  Mild depression/anxiety.  5.  History of insomnia.   I will refill the patient's Norco 5/325 mg one p.o. q.4-6h., #150. He does  not need refills on any other medications. He also takes Lidoderm  one to  three patches, 12 hours on and 12 hours off. He is also on Effexor 75 mg one  p.o. daily, Neurontin 300 mg t.i.d.  He is stable, stays highly functional.           ______________________________  Brantley Stage, M.D.     DMK/MedQ  D:  10/22/2005 12:59:15  T:  10/22/2005 22:22:25  Job #:  [id number]

## 2010-10-23 NOTE — Assessment & Plan Note (Signed)
[name] is back in today for a brief recheck and refill of his  medications.  He was last seen back in June 2006.  He had a nursing visit on  December 17, 2004.  He has been doing well with his medications, has been stable  in regard to his pain and appropriate with his medication counts.   He reports his average pain at about a 3 on a scale of 10.  Relief with  medications is quite good.  The pain is specifically located in the low back  area.  He get a deep ache in this area, especially with prolonged sitting.   He has questions today regarding a job change.  Apparently he has been  offered a promotion with a move to [place] into the [place] [employer] area.  He will need to do a bit more driving, he is not quite  sure how much.  Currently the job he is in does not require a lot of  sitting.  He is able to sit and stand as he needs to; however, with the new  job he may need to do a little bit more driving, possibly a moderate amount  of driving, he is not sure.  He is wondering if he will be able to tolerate  this change in activity.   He is working 40 hours a week currently.  Has no major problems on review of  systems.   Past medical, social and family history are unchanged since last visit.   PHYSICAL EXAMINATION:  VITAL SIGNS:  Blood pressure 133/80, pulse 90,  respirations 16, 97% saturated on room air.  GENERAL:  He is a well-developed, well-nourished gentleman.  He is oriented  x3.  His affect is bright, alert, cooperative, pleasant.  He appears a bit  agitated this morning and anxious regarding the decisions he needs to make  regarding his new job.  He does a bit of pacing in the room.  MUSCULOSKELETAL/NEUROLOGIC:  Exam today reveals normal gait, excellent range  of motion in the lumbar spine.  He tells me he is having a good day.  Reflexes are symmetric and intact to the patellar tendons and Achilles  tendons bilaterally.  Straight leg raise is negative.   He has excellent  strength at his hip flexors, knee extensor, dorsiflexors, plantar flexors,  EHL, and no numb spots appreciated in the lower extremities.   IMPRESSION:  1.  Degenerative disk disease, L3-4, L4-5.  2.  Facet arthropathy, L3-4, L4-5, L5-S1.  3.  Deconditioned/obesity.  4.  Poor sleep.  5.  Mild depression/anxiety.   PLAN:  The patient is doing well on his current medications, requesting  refills.  Lidoderm one to three patches, 12 hours on, 12 hours off, #90, are  given in a prescription; Effexor XR 75 mg one p.o. daily, #30 with two  refills; and Neurontin 300 mg one p.o. q.8h., #90 with three refills; and  Norco is also refilled, 7.5/325 mg one p.o. five times a day p.r.n. back  pain, #150, no refills on that one.  We will see him back in a month.       DMK/MedQ  D:  01/13/2005 12:40:06  T:  01/13/2005 15:59:30  Job #:  [id number]

## 2010-10-23 NOTE — Assessment & Plan Note (Signed)
Edward Wilkerson is a 60 year old married white gentleman who is back in today  for recheck and refill of his medications.   He reports he has been golfing some and had noted worsening of his low back  symptoms after his golf game.  He also stated he lost approximately 13  pounds in the last few weeks as well. He has been controlling his calories  more.  Apparently he has been getting a little more exercise, been working  out in the yard some as well.  He is planning a trip to [place] driving.  He has concerns about his low back during that long drive.   He has also been working quite a bit at work, 12-hour days, for the last  week and a half.   He admits to low back pain which has continued, averages a 5 on a scale of  10.  It is intermittent and varies with activity levels.  Worse during the  daytime.  Gets good relief with medications.  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  Denies any problems on the health and history  form.   REVIEW OF SYSTEMS:  No new changes on past medical, social, or family  history.   PHYSICAL EXAMINATION:  VITAL SIGNS:  Blood pressure 144/66, pulse 98,  respirations 18, 93% saturation on room air.  GENERAL:  Well-developed, mildly obese gentleman who does not appear in any  distress.  Oriented x3.  Affect bright and alert.  Slightly anxious.  MUSCULOSKELETAL:  Able to stand independently after being seated.  Gait is  normal in the room.  He does not display any pain behaviors.  He has some  limitations in lumbar range of motion.  Reflexes otherwise symmetric and  intact in the lower extremities.  5/5 strength in the lower extremities.  Negative straight leg raising.  Minimal tenderness to palpation of the  lumbar paraspinal musculature.   MEDICATIONS:  1.  Effexor 75 mg daily.  2.  Hydrocodone 7.5 mg five times a day.  3.  Neurontin 300 mg q.8h.  4.  Recently also started on Levaquin.   IMPRESSION:  1.  Degenerative disk disease at  L3-4, L4-5.  2.  Facet arthropathy of L3-4, L4-5, and L5-S1.  3.  Deconditioned/obesity.  4.  Poor overall sleep, has improved somewhat.  5.  Mild depression and anxiety, has improved somewhat on Effexor.   PLAN:  1.  Encouraged him to use TENS unit.  2.  Continue to monitor his body mechanics.  3.  Continue exercise program.  4.  Refilled the following medication for him:  Norco 7.5/325 one p.o. up to      five times a day, #150, Lidoderm 5% one to three patches 12 hours on and      12 hours off, #90, and Neurontin 300 mg one p.o. q.8h., #90.  We will      add Naprosyn 500 mg one p.o. b.i.d. not more than 10 days a month.  5.  Discussed possibility of getting him involved in a therapy program with      emphasis on body mechanics during sporting activities.  He would like to      possibly pursue this further at some point.  He has otherwise been      stable.      Medications have been appropriate.  6.  I will see him back in a month.      DMK/MedQ  D:  10/21/2004 14:13:21  T:  10/21/2004 14:42:51  Job #:  [id number]

## 2010-10-23 NOTE — Assessment & Plan Note (Signed)
Edward Wilkerson is a 60 year old gentleman who is being seen in our Pain and  Rehabilitative Clinic for chronic low back pain.   He is status post microdiscectomy 2001, Dr. Lovell Sheehan.   He has history of degenerative disc disease L4-5, L3-4 facet arthropathy at  multiple levels.  Also has a history of underlying anxiety.   He is back in today for a brief recheck and refill of his medications.  He  was last seen June 30, 2005.  His pain is controlled very well with  Norco.  He takes up to five 5/325 mg tablets per day.  He also uses Lidoderm  and Neurontin and an occasional Naprosyn as adjuvants and is doing quite  well on Effexor as well.   Overall he reports good pain relief with the current medications that  he is  on.  His pain is described as rather intermittent and sharp, especially in  the left low back area.   His pain comes on for no apparent reason and sometimes can be quite sharp.  Overall, he has been doing well.  He has been exercising.  He is up to  walking 60 minutes a day.  He is able to climb stairs.  He is driving.  He  works 40 hours a week.  He is independent with all of his self care.   No new changes regarding health and history form review of systems.  No  changes in past medical, social or family history.   PHYSICAL EXAMINATION:  GENERAL APPEARANCE:  He is a well-developed, well-  nourished gentleman.  Does not appear in any distress.  He is oriented x3.  His affect is bright, somewhat anxious in his demeanor overall, however,  this is typically how he presents.  VITAL SIGNS:  Blood pressure 145/76, pulse 98, respirations 16, 97%  saturated on room air.  NEUROLOGIC:  He is able to stand up easily.  Gait is normal in the room.  He  has some limitations in lumbar extension, however, overall,  his lumbar  motion is quite good.  Does not appear to have any pain behaviors associated  today. Reflexes are symmetric, intact in lower extremities.  Straight leg  raising  is negative.  Motor strength 5/5 in lower extremities.  No focal  weakness noted.  No abnormal tone is noted.  Balance is good.   IMPRESSION:  1.  Status post laminectomy 2001, Edward Wilkerson.  2.  Lumbar spondylosis.  3.  Mild depression/anxiety.  4.  History of insomnia.   PLAN:  Patient does not need refills today of Lidoderm, Effexor or  Neurontin.  He takes Naprosyn very rarely at this point.  We will refill his  Norco 5/325 one p.o. q.i.d. and h.s.  #150 no refills.  Will have the  nursing staff see him back over the next two months.  I will see him back in  three months.  He has been stable on these medications.  His activity levels  are quite good, working full time and exercising.  Overall he is doing quite  well.           ______________________________  Brantley Stage, M.D.     DMK/MedQ  D:  07/28/2005 13:56:10  T:  07/29/2005 10:36:19  Job #:  [id number]

## 2010-10-23 NOTE — Assessment & Plan Note (Signed)
HISTORY OF PRESENT ILLNESS:  Edward Wilkerson is a 60 year old married gentleman  who is working full time as an Print production planner.  He is being seen in our Pain  and Rehabilitative Clinic for chronic low back pain.  He is status post a  laminectomy in 2001 by Dr. Lovell Sheehan with chronic low back pain.  He states he  recently saw Dr. Lovell Sheehan and was told he may have a disk at another level,  the level above, which might be symptomatic at this time as well.  Mr.  Edward Wilkerson also has some mild depression and chronic anxiety, history of  insomnia.  He is back into our clinic for a refill of his medications.  He  states there have been no new medical problems in the interim other than  that his primary care physician told him his blood sugars seem to be  creeping up a little bit.  They are watching him regarding this.  His  average pain in the low back is between a 3 and 4 on the average, is rather  constant and dull in nature.  Worse with sitting and certain activities,  bending and twisting, improved with rest and medication.   MEDICATIONS:  He gets good relief with the current meds that he is on, which  include the following:  1. Neurontin 300 mg 3 times a day.  2. Effexor 75 mg q. a.m.  3. Lidoderm on a p.r.n. basis.  4. Norco 5/325 5 times a day on a p.r.n. basis.  5. For occasional flareups during the month he takes Naprosyn 500 twice a      day, not more than 10 days a month.  He reports no side effects from these medications and no untoward effects  from these.   REVIEW OF SYSTEMS:  Is noted on the health and history form and is attached  to the chart today.   SOCIAL HISTORY:  Unchanged.   FAMILY HISTORY:  Unchanged.   The patient continues to do some walking everyday up to 60 minutes.  He is  able to climb stairs, drive.  He is independent with all of his self-care  and is a very high functioning individual.   PHYSICAL EXAMINATION:  VITAL SIGNS:  Blood pressure:  140/68.  Pulse:  92.  Respirations:  16.  96% saturation on room air.  GENERAL:  He is a well developed, well nourished, mildly obese gentleman who  appears his stated age.  He does not appear in any distress.  He is oriented  x3.  His affect is bright, alert.  He is cooperative, pleasant, he seems a  bit anxious but this is not new.  His speech is clear.  He follows commands  without any difficulty.  He transitions from sit to stand quite easily.  His  gait is completely normal in the room.  There is no antalgia.  Gait  symmetric.  He has excellent forward flexion, does report discomfort and  range.  He has very good extension in the lumbar spine as well and, again,  he reports some discomfort with that.  Seated, his reflexes are symmetric  and intact in the lower extremities.  His motor strength is 5/5 in the lower  extremities.  There is no focal weakness appreciated.  Sensory exam is  unchanged.  Reflexes are 2+ at the patellar tendons and 2+ at the Achilles  tendon.  No abnormal tone is noted.  Normal bulk is appreciated.   IMPRESSION:  1.  Lumbago.  2. Status post laminectomy in 2001 by Dr. Lovell Sheehan.  3. Lumbar spondylosis.  4. Degenerative disk disease.  5. Mild depression/anxiety.  6. History of insomnia.   PLAN:  Will refill Edward Wilkerson's Naprosyn 500 mg 1 p.o. b.i.d. p.r.n. back  pain flareup, #20, no refills.  Will also refill Norco 5/325 1 p.o. q. 4-6  hours p.r.n. back pain, #150, no refills.  Next month, he will probably need  a refill on his Lidoderm and his Effexor XR 75 mg 1 p.o. q. a.m. and  Neurontin 300 mg 1 p.o. t.i.d.  Will have him seen by nursing staff over the  next 2 months.  I will see him back in 3 months.  He has been stable on  these medications.  He uses them appropriately.  He has not displayed any  aberrant behavior in our clinic.  He continues to work full time and engage  in some exercise as well.           ______________________________  Brantley Stage,  M.D.     DMK/MedQ  D:  04/13/2006 08:51:49  T:  04/13/2006 13:32:45  Job #:  [id number]

## 2010-10-23 NOTE — Assessment & Plan Note (Signed)
DATE OF VISIT:  August 26, 2004.   MEDICAL RECORD NUMBER:  [id number].   DATE OF BIRTH:  04-10-1951.   Edward Wilkerson is back in for a brief recheck and refill of his medications.  He is a 60 year old, married, white gentleman being seeing in our pain and  rehabilitative clinic for chronic low back pain.  He has a history of  degenerative disk disease and facet arthropathy.   He reports he has been getting good relief on the current medications,  including Neurontin 300 mg one p.o. q.8h., Lidoderm and Norco 7.5 mg,  approximately five tablets a day.   He reports is average pain score is about a 7 on a scale of 10.  Today it is  about a 3.  He describes his pain as aching, worse with prolonged sitting  and improves with rest, therapy and medications.  The pain is worse in the  morning.   Mobility:  The patient is able to walk about 60 minutes at a time.  He is  currently engaging in an exercise program.  He is able to climb stairs.  He  is able to drive.  He works as an Print production planner, employed 40 hours a week.  Admits to anxiety and depression and has been taking Effexor for this.  The  review of systems and past medical, social and family history are unchanged  since last visit.   PHYSICAL EXAMINATION:  Mr. Caetano is a well-developed, well-nourished  gentleman.  He is oriented x 3.  His affect is overall bright and alert.  He  seems somewhat anxious today, however.  His gait is normal in the room.  He  has good motor strength and normal tone in the lower extremities.   IMPRESSION:  1.  Degenerative disk disease at L3-4 and L4-5.  2.  Facet arthropathy at L3-4, L4-5 and L5-S1.  3.  Deconditioned/obesity.  4.  Overall poor sleep.  Has actually been improving.  5.  Mild depression and anxiety appears to be improving since being on      Effexor.   PLAN:  Will continue him on his current medications.  He is able to get  Effexor samples from his daughter at this time.  He has  been appropriate  with his Norco use.  Will see him back in one month.      DMK/MedQ  D:  08/26/2004 17:42:08  T:  08/27/2004 09:46:37  Job #:  [id number]

## 2010-10-23 NOTE — Assessment & Plan Note (Signed)
Edward Wilkerson is back in today.  He is a 60 year old married white gentleman  who is being seen for chronic low back pain.  He has degenerative disk  disease at L3-4, L4-5, and facet arthropathy at multiple levels as well.   He reports overall he has been doing very well.  His function seems to be  overall improved.  He has been playing tennis.  He has spent the last two  weeks off of work on vacation but has been quite active at home.  He has  been climbing ladders, cleaning out his gutters, carpet cleaning, and doing  a variety of tasks around the home.  Despite his increased activity, he  really has done quite well with respect to his pain.  He has had some good  days and bad days.  He still reports fairly constant pain in the low back,  average about a 6 on a scale of 10.  His sleep is, however, fair, and he  reports good relief with the current medications he is taking.   He is able to walk pretty much as long as he wants to.  He is able to climb  stairs.  He drives.  He does work 40 hours a week.  No new changes in review  of systems.   Past medical, social, family history unchanged since last visit.   PHYSICAL EXAMINATION:  VITAL SIGNS:  148/86, pulse 89, respirations 16, 98%  saturated on room air.  GENERAL:  He is a well-developed, well-nourished gentleman, does not appear  in any distress.  He is oriented x3.  His affect is overall very bright.  He  is alert and cooperative, pleasant today.  He is in very good spirits.  He  is not displaying as much anxiety as I typically see with him.  MUSCULOSKELETAL/NEUROLOGIC:  He is able to stand easily after being seated.  His gait is normal.  He has mild limitations in lumbar range with respect to  extension and flexion.   His reflexes, however, are 2+ at the knees, 1+ at the ankles.  Excellent  strength.  Negative straight leg raise.   IMPRESSION:  1.  Degenerative disk disease, L3-4, L4-5.  2.  Facet arthropathy, L3-4, L4-5,  L5-S1.  3.  Deconditioned/obesity.  4.  Poor sleep.  5.  Mild depression/anxiety.   PLAN:  Will decrease hydrocodone today from 7.5 mg five times a day to 5/325  mg five times a day on a p.r.n. basis.  Will also refill his Prilosec 20 mg  one p.o. daily, #30, and his Lidoderm 5% up to two patches, 12 hours on, 12  hours off, #90.   He has refills on his Neurontin.  He continues to take Neurontin 300 mg  every eight hours.  He also takes Effexor XR 75 mg one p.o. q.a.m., #30 each  month.  He has been doing well on that.  We may reassess him in another six  months to see if he would like to be weaned off of that and assess him  clinically again.  Overall he has been doing quite well.  He stays very  active and has done well with his current medicines.  Will encourage him to  continue to stay active and will see him back in a month.           ______________________________  Brantley Stage, M.D.     DMK/MedQ  D:  02/11/2005 12:21:16  T:  02/11/2005 12:55:54  Job #:  [id number]

## 2010-10-23 NOTE — Assessment & Plan Note (Signed)
HISTORY OF PRESENT ILLNESS:  Edward Wilkerson is a 60 year old married white  gentleman who works full time. He is being seen in our pain and  rehabilitative clinic for chronic low back pain. He has a history of  degenerative disk disease L3-4, L4-5, facet arthropathy at multiple levels,  and underlying anxiety   He is back in today and reports that he had about 3 days ago, as he was  getting out of bed and pulled the covers back, twisted his back and  developed sudden onset of left low back pain, radiating to the posterior  left thigh.   This has persisted over the last several days. He finds it rather upsetting.  He is frustrated. He intends to be quite busy over the holidays. He is going  on an out of town trip and finds that his discomfort makes it difficult for  him to sit for any length of time currently and it is disrupting his  function somewhat. His average pain is about a 4 on a scale of 10. He is  still able to sleep fairly well. The pain is typically worse in the morning.  It gets a little better with stretching and seems to come back when he sits  for a period of time. He gets good relief with current meds that he is on.   CURRENT MEDICATIONS:  1.  Hydrocodone 5/325, not more than 5 per day.  2.  Lidoderm.  3.  Effexor 75.  4.  Neurontin 300 at night.   He admits to trying to wean his Effexor. He has been taking it every other  day. He feels that he would like to get off of this medication and see how  he does.   He can walk about an hour without any difficulty. He is able to climb  stairs. He drives. He is still working 40 hours a week.   REVIEW OF SYSTEMS:  Negative.   PAST MEDICAL/SOCIAL/FAMILY HISTORY:  Unchanged since last visit.   PHYSICAL EXAMINATION:  VITAL SIGNS:  Blood pressure 127/84, pulse 87,  respiratory rate 16, 96% saturated on room air.  GENERAL:  A well developed, well nourished gentleman. He appears somewhat  agitated this morning. He varies from  sitting to standing in the room, doing  some little stretching maneuvers, bending forward and laterally as he  stands.  NEUROLOGIC:  He is oriented x3. Affect is alert and cooperative. His gait is  stable. He has a slight antalgic gait, which tends to even out after he has  been walking a little bit. He has limitations in range of motion in all  planes, especially with extension today, which is a change for him. Seated,  reflexes are symmetric at the patellar tendons and Achilles tendons. He has  no increased tone. No clonus is noted. He has good motor strength at hip  flexors, knee extensors, dorsiflexors, plantar flexors, EHL. He has quite a  bit of pain when I have him extend and rotate to the left simultaneously.  Tender with palpation in the left lumbar paraspinal musculature today as  well.   IMPRESSION:  1.  Lumbar strain injury superimposed on lumbar spondylosis.  2.  Mild depression/anxiety.  3.  History of insomnia.   PLAN:  Will refill the following medications for him today:  1.  Norco 5/325 1 p.o. q.4-6 h., not more than 5 per day, #150.  2.  Will also add Flexeril today 5 mg 1 p.o. q. p.m. p.r.n.  back spasm, #10.  3.  Will decrease his Effexor per his wishes to wean off of it. Effexor XR      37.5 one p.o. daily, #30 with 2 refills.  4.  Will give him a 10 day trial of some Naprosyn 500 mg 1 p.o. q.12 h.,      #20, no refills.   Will see him back in about 6 weeks.           ______________________________  Brantley Stage, M.D.     DMK/MedQ  D:  05/05/2005 10:21:04  T:  05/05/2005 16:21:20  Job #:  [id number]

## 2010-10-23 NOTE — Assessment & Plan Note (Signed)
[name] is a 60 year old, married, white gentleman who is being seen in  our pain and rehabilitative clinic for chronic low back pain, overall  deconditioning, weight gain, poor sleep, and mild depression.   He is back in today for a refill of his medications and a brief recheck.  He  reports that his average pain located in the low back area across the low  back about a 6 on a scale of 10.  Here in the office right now, it is about  a 3 on a scale of 10.  It moderately interferes with his general activity to  just a little.  His sleep overall is fair.  The morning is the worst time  for him with respect to his pain.  He gets fairly good relief with his pain  medications.  The pain is worse with sitting.  The pain improves with  therapy, exercise, and medication.  He is able to walk about 60 minutes,  able to climb stairs, able to drive.  He walks without any assistance.  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  He requires no assistance  with any of his activities of daily living.  He denies any bowel or bladder  control problems.  He denies any suicidal ideation.  He does admit to  depression and anxiety.   No changes in his past medical history, social history, or family history  since last visit.   PHYSICAL EXAMINATION:  VITAL SIGNS:  Blood pressure is 126/85, pulse 92,  respirations 16 to 18, O2 sat is 95% on room air.  GENERAL:  He is an alert and oriented gentleman in no apparent distress.  MUSCULOSKELETAL:  He is able to get out of the chair without any difficulty.  His gait is not antalgic.  He has some limitations in forward flexion and  extension of the lumbar spine, some mild tenderness to palpation in the  lumbar paraspinal muscles.  Most of his pain he reports, however, is quite  deep.  It does not really change with any palpation.  Seated, reflexes are  2+ at the knees and ankles.  Toes are downgoing.  No clonus.  Strength 5/5  in the lower extremities.   Sensation is intact with light touch.   IMPRESSION:  1.  Degenerative disk disease, L3-L4, L4-L5.  2.  Facet arthropathy, L3-L4, L4-L5, L5-S1.  3.  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  4.  Overall deconditioning and weight gain.  5.  Poor overall sleep.  6.  Mild depression/anxiety.   PLAN:  1.  We will continue Effexor XR 75, one p.o. q.a.m.  2.  We will continue Neurontin 300 mg q.8h.  3.  Lidoderm.  4.  TENS unit as needed.  5.  We will encourage exercise, would like to see him walking 3-4 times a      week 30-40 minutes.  He does not have a regular exercise program at this      time.  Would encourage him to keep a log.  We talked about him doing      some walking, possibly starting on exercise bicycle and possibly getting      involved in some pool program.  His weight is about 220 pounds and he      would like to try to loose some over the next several months as well.  6.  He is currently stable on his current medications.  He has been taking      medicines appropriately.  7.  We will see him back then in a month.       DMK/MedQ  D:  06/24/2004 17:10:20  T:  06/24/2004 22:10:48  Job #:  [id number]   cc:   Lovell Sheehan, Dr.   Leanne Chang, M.D.  [address]  [place]  [place] [address]  Fax: [phone]

## 2010-10-23 NOTE — Group Therapy Note (Signed)
REFERRAL SOURCE:  Dr. Lovell Sheehan   HISTORY:  [name] is a 60 year old married white male who is being seen  at our pain and rehabilitative clinic today for chronic low back pain.   His low back pain developed in May 2001 after participating in a softball  game.  He developed a gradual worsening of right lower extremity pain which  was quite intense making it difficult for him to even walk or sleep.  He was  seen by Dr. Lovell Sheehan and on November 19, 1999 he underwent an L4-5  microdiscectomy.   He reports the surgery was quite successful in diminishing his pain levels.   However, over the last several years he has continued to have chronic low  back pain and some intermittent pain into the right lower extremity.  The  back is his main issue, the low back area is his main issue, it continues to  stay at about a 6 or 7 on a scale of 10, it can go down to a 1, worst it  really gets is about a 6 or a 7.   The pain is exacerbated by activities especially prolonged sitting and any  kind of activity that requires him to do any kind of bending or twisting.   The pain in his back does wax and wane in intensity but it is there most of  the time at a low-grade ache and can get worse depending on the activities.   He denies any problems of persistent weakness, numbness, tingling, he denies  any bowel or bladder control issues, denies any clumsiness or gait problems.   He is an extremely high functioning individual.  He is independent with all  of his self-care and mobility.  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   Emotionally he reports he is coping okay, denies harm to self or others at  this time.   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   Mother died age 45 had problems with diabetes.  Father died age 17  myocardial infarction/was a smoker.  Has five sisters, one brother.  There  is some diabetes and breast cancer in the family otherwise.   ALLERGIES:  Reports an allergy skin rash with a SULFA DRUG.   REVIEW OF SYSTEMS:  Review of systems is reviewed in the health and history  form essentially are all related to his back pain.   PAST MEDICAL HISTORY:  Past medical history is remarkable for hypertension,  history of low platelet count, and sleep apnea.   PAST SURGICAL HISTORY:  Surgical history is remarkable for the  microdiscectomy 2001 Dr. Lovell Sheehan.  He is status post an appendectomy 1991  and herniorrhaphy 1977.  He also has had a colonoscopy back in 2003.   MEDICATIONS:  Medications at this time include Allegra 180 mg once daily,  Benicar/hydrochlorothiazide 20/12.5 and Vicoprofen 7.5/200 one to two p.o.  q.6h.   PHYSICAL EXAMINATION:  Exam today reveals and alert, cooperative, pleasant  gentleman whose overall affect is quite bright.  His blood pressure is  111/78, pulse 90, respirations 14, 97% saturated on room air.  He is able to  get out of the chair quite easily.  Gait in the room is initially somewhat  stiff with the first few steps, it  evens out after he takes a few steps in  the room.  He is able to get on the exam table easily.  Reflexes are 2+ at  the knees, 2+ at the ankles, toes are downgoing, no clonus is noted.  He has  intact sensation to light touch in the lower extremities.  Motor strength at  hip flexors, knee extensors, dorsiflexors, plantar flexors, evertors, EHL  and knee flexors are 5/5 with the exception of the right EHL is slightly  weaker than the left EHL.  No evidence of any kind of foot drop or gait  abnormality.   He reports some tenderness especially in the right lower lumbar areas.  He  has some increased pain with rotation and extension to the right, also  somewhat on the left, not as bad on the left as on the right, forward  flexion bothers him  when he tries to return to extension.   IMPRESSION:  1.  Degenerative disk at L3-4, L4-5.  2.  Facet arthropathy L3-4, L4-5, L5-S1.  3.  Right L5 perineural fibrosis noted on MRI in past.  4.  General deconditioning and weight gait.  5.  Poor sleep.   PLAN:  Spent 30 minutes talking with Mr. Repass today regarding his  options.  He recently just filled his Vicoprofen and essentially a full-  month supply.  At this time we are not going to switch any of his  medications.  We discussed the possibility of making some changes at our  next visit.  May consider adding a longer acting narcotic for him and using  a nonsteroidal intermittently.  If we do end up using nonsteroidal fairly  consistent we would recommend adding something such as Prevacid to his  current medication regime.  I would also consider Elavil or tricyclic for  helping him sleep at night.   We also discussed having him see a physical therapist for some information  and education to improve the ergonomics of his desk computer setup at work.   We also discussed the need for him to find an activity he can participate in  again which would also help him with maintaining his weight and general  conditioning.  He has difficulty golfing and playing softball at this point  due to changes in his lumbar spine.  Would like to see him doing something  at least a couple of times a week that would get him more active.  We may  need to have a therapist work with him on that as well.  We will see him  back in 1 month and we will discuss further options with him.  I also  brought up a possibility of lumbar spine injections and talked about the  various injection options that are available including facet injections.  At  this point he has reservations about doing anything that invasive at this  time.  We will continue to work with him over the month to adjust his pain medications to impact his overall functional status.  We will have a  copy of  this dictation sent over to Dr. Lovell Sheehan and we will see Mr. Tomko back in  1 month.     Brantley Stage, M.D.   DMK/MedQ  D:  02/05/2004 13:01:29  T:  02/05/2004 22:43:08  Job #:  [id number]   cc:   Dr. Lovell Sheehan

## 2010-10-23 NOTE — Assessment & Plan Note (Signed)
Edward Wilkerson is a very pleasant 60 year old gentleman who was last seen by me  on May 18.  He has had several nursing visits in the interim.  He is being  seen in our pain and rehab clinic for chronic low back pain.  He is status  post a laminectomy approximately six years ago now, microdiskectomy by Dr.  Lovell Sheehan.  He continues to have intermittent low back pain depending on his  activity level.   His average pain is about a 5 on a scale of 10.  Interferes a little with  general activity.  The pain is worse in the morning.  Recently, it has been  worse with standing although in the past sitting has been his aggravating  factor.  He believes he had a recent flare up.  He is not sure what he did  and now he has some pain with ambulation.  He did take some Naprosyn for a  few days last month and overall his flare up seems to be resolving.   He is able to walk about 60 minutes at a time.  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  No new problems regarding past medical,  social or family history since his last visit with me.   PHYSICAL EXAMINATION:  VITAL SIGNS:  Blood pressure is 144/84, pulse 85,  respirations 16.  Saturation 97% on room air.  GENERAL:  He is a well-developed, well-nourished gentleman.  He is oriented  x3.  Affect is alert.  He is cooperative, pleasant although somewhat on the  anxious side.  NEUROLOGICAL:  He transitions from sit to stand without any difficulty.  His  gait is normal in the room.  He has a little bit of discomfort with  extension, rotation combination.  Limitations in forward flexion are noted  as well.  Seated, reflexes are symmetric and intact.  His motor strength is  good in both lower extremities.  Sensory exam is negative.  [name]'s test  and tandem gait are normal.  No focal weakness is noted.  No new sensory  deficits are appreciated.   IMPRESSION:  1. Lumbago.  2. Status post laminectomy in 2001 by Dr. Lovell Sheehan.  3. Lumbar  spondylosis.  4. Mild depression/anxiety.  5. History of insomnia.   PLAN:  We will refill Mr. Laris's lidocaine up to 3 patches 12 hours on,  12 hours off, #90, three refills.  We will refill Norco.  He continues to  take 1 5/325 pills four to five times per day #150, no refills.  We will  also give him Naprosyn 500 mg 1 p.o. b.i.d. p.r.n. back pain #20 for any  further flare ups.  We will see him back in three months.  He has been  stable on his medications.  He uses them appropriately.  He gets a release  so that he can continue to work full time and engage in physical activities  to help him lose weight.  He has displayed no aberrant behavior in our  clinic.           ______________________________  Brantley Stage, M.D.     DMK/MedQ  D:  01/17/2006 13:44:02  T:  01/18/2006 06:52:04  Job #:  [id number]

## 2010-10-23 NOTE — Discharge Summary (Signed)
[facility]  Patient:    Edward Wilkerson, Edward Wilkerson                      MRN: [id number] Adm. Date:  [id number] Disc. Date: [id number] Attending:  Cristi Loron CC:         Titus Dubin. Alwyn Ren, M.D. [facility]  [phone]                           Discharge Summary  HISTORY OF PRESENT ILLNESS:  For full details of this admission, please refer to typed History and Physical.  BRIEF HISTORY:  The patient is a 60 year old white male who suffered from back and right leg pain unresponsive to medical management.  He was worked up as an outpatient with a lumbar MRI demonstrating herniated nucleus pulposus L4-5 on the right, and signs and symptoms and physical exam were consistent with a right L5 radiculopathy.  He, therefore, weighed the risks and benefits and alternatives of surgery and decided to proceed with a microdiscectomy.  For past medical history, past surgical history, medications prior to admission, drug allergies, family medical history, social history, admission physical exam, admitting assessment and plan, etc., please refer to typed History and Physical.  HOSPITAL COURSE:  I performed a right L5-S1 microdiscectomy using microdissection on 11/19/99 without complication.  (For full details of this operation, please refer to typed operative note.)  POSTOPERATIVE COURSE:  The patients postoperative course was only remarkable for slowness to ambulate.  He did not ambulate adequately on postoperative day number one to discharge.  But by postoperative day number two, he was afebrile, vital signs stable, eating well, ambulating well.  His wound was healing well without signs of infection.  He was, therefore, discharged to home.  During his hospitalization, I did discuss with him he had elevated LFTs, and we did discuss the fact that he consumes large amounts of alcohol.  We discussed this, and I highly recommended that he abstain from drinking alcohol and look into an  Alcoholics Anonymous program; both he and his wife were encouraged.  DISCHARGE INSTRUCTIONS:  The patient was instructed to follow up with me in three weeks.  DISCHARGE MEDICATIONS: 1.  Percocet 10 #61 p.o. q.4 p.r.n. for pain, no refills. 2.  Valium 5 mg #40 one p.o. q.6h. p.r.n. muscle spasms, no refills.  FINAL DIAGNOSIS:  L4-5 degenerative disease, herniated pulposus, spinal stenosis, lumbago, lumbar radiculopathy.  PROCEDURE PERFORMED:  Right L4-5 microdiscectomy using microdissection. DD:  11/21/99 TD:  11/24/99 Job: [id number]

## 2010-10-23 NOTE — Assessment & Plan Note (Signed)
[name] is a 60 year old married gentleman who was last seen in our  [facility] on December 21, 2006.  He is back in today  for a refill of his medications.  He is being seen for chronic low back  pain.  He is status post a laminectomy in 2001 by Dr. Lovell Sheehan, has  lumbar spondylosis and degenerative disc disease, as well as some mild  depression and anxiety which is controlled with Effexor.   He states his average pain is between a 3 and a 4 on a scale of 10.  His  pain is intermittent, worse with certain activities, improves with rest  and medication.  He gets good relief from the meds he is on currently.  He is able to walk 60 minutes at a time.  He is exercising about 60  minutes twice a week.  He is able to climb stairs and drive.  He works  40 hours a week.  He is independent with his self care.  He is an  overall high-functioning individual.  He plans to play softball tonight  with friends.  Bowel and bladder without any problems.  Denies any  problems with depression, anxiety or suicidal ideation at this time.   REVIEW OF SYSTEMS:  Noncontributory.   Past medical, social, family histories are unchanged.  He does have a  new diagnosis of diabetes which is controlled currently, and he is also  making some dietary changes, as well.   PHYSICAL EXAMINATION:  VITAL SIGNS:  Blood pressure is 105/53, pulse 85,  respirations 18, 95% saturated on room air.  GENERAL:  He is a well-developed, well-nourished gentleman who appears  his stated age.  He appears a bit anxious, but his affect is bright,  alert and cooperative, as well as pleasant.  He is oriented x3.  Speech  is clear.  He follows commands without difficulty.  NEUROLOGIC:  Transitioning from sit-to-stand is done with ease without  any pain behaviors.  Gait in the room is normal.  Lumbar motion is  mildly limited and without pain.  However, his reflexes are symmetric  and intact in the lower extremities; no  abnormal tone is noted.  Motor  strength is 5/5 at hip flexors, knee extensors, dorsiflexors, plantar  flexors and EHL.  No sensory deficits are appreciated today.  Coordination is intact.   IMPRESSION:  1. Lumbago.  2. Status post laminectomy in 2001 by Dr. Lovell Sheehan.  3. Lumbar spondylosis.  4. Degenerative disc disease.  5. Mild depression/anxiety, well controlled currently with Effexor 75      mg 1 by mouth daily.  6. History of insomnia which is currently not a problem at this time.   PLAN:  We will reduce Norco further from 5/325 1 by mouth 4 times a day  down to 5/325 1 by mouth 3 times a day.  We will drop Neurontin from  twice a day down to 300 mg 1 by mouth in the evening, continue Effexor,  and encourage him to continue to walk at least 2-3 times a week.  He has  been stable on the above medications.  He has not displayed any aberrant  behavior.  He takes his medications as prescribed.  In the upcoming  months anticipate we will reduce Norco further.  May consider trialing  him on Tramadol for a month or two, as well.           ______________________________  Brantley Stage, M.D.  DMK/MedQ  D:  01/19/2007 13:34:38  T:  01/20/2007 10:15:37  Job #:  [id number]

## 2010-10-23 NOTE — Discharge Summary (Signed)
[facility]  Patient:    Edward Wilkerson, Edward Wilkerson                   MRN: [id number] Adm. Date:  [id number] Disc. Date: 04/26/00 Attending:  Colon Branch Dictator:   Annett Fabian, P.A. CC:         Titus Dubin. Alwyn Ren, M.D. [facility]  Theron Arista C. Eden Emms, M.D. [facility]   Referring Physician Discharge [facility]  DISCHARGE DIAGNOSES: 1. Chest pain, noncardiac origin. 2. Chronic back pain.  HISTORY OF PRESENT ILLNESS:  The patient presented to the ER, complaining of some shooting left-sided chest pain, sharp in nature, with no radiation, shortness of breath, nausea, vomiting, or diaphoresis.  The patient was admitted by Theron Arista C. Eden Emms, M.D., with a diagnosis of rule out MI.  HOSPITAL COURSE:  The patient remained stable and pain-free during the rest of the evening.  The next morning, the patient underwent a stress exercise Cardiolite.  Nuclear images revealed no ischemia, no wall motion abnormalities, and an ejection fraction of 67%.  EKG revealed normal sinus rhythm with sinus arrhythmia and no ischemic changes.  LABORATORY VALUES:  Cardiac enzymes negative for MI.  Sodium 139, potassium 3.7, chloride 109, CO2 23, BUN 17, creatinine 1.0, glucose 107. LFTs were normal with the exception of an SGPT of 59.  Note that the patient has a history of liver enzyme elevation for approximately five months.  This is believed to be in connection to ETOH consumption.  CBC results:  White count 7.3, hemoglobin 16.7, hematocrit 45, platelets 133.  Chest x-ray revealed normal cardiac silhouette and no active disease.  DISCHARGE MEDICATIONS: 1. Enteric-coated aspirin 325 mg one tablet q.d. 2. Vicodin ES one to two tablets q.8h. p.r.n. pain.  DISCHARGE INSTRUCTIONS:  The patient was advised to follow a low-salt diet. 2. The patient was advised to follow up with Titus Dubin. Alwyn Ren, M.D., for further investigation of his chest pain.  DISPOSITION:  The patient was discharged  home in stable condition. DD:  04/26/00 TD:  04/26/00 Job: [id number]

## 2010-10-23 NOTE — Assessment & Plan Note (Signed)
MEDICAL RECORD NUMBER:  [id number].   Mr. Disano is back in for recheck today. He is a 60 year old married white  gentleman who is being seen in our pain and rehabilitative clinic for  chronic low back pain which has diskogenic as well as facet arthropathy  component to his pain syndrome.   He reports his average pain is about a 6 on a scale of 10; currently it is  about a 2. Again, it is typically located in the low back. It is  nonradiating, worse with various activities. Relief from medications is  quite good. 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 He has  also continued to walk 3 to 4 times a week.   He is considering getting involved in a more structured exercise program.  Apparently, he and his wife are joining a gym in the fall, and he may need  some assistance and guidance in the type of exercise he performs at the gym.   He is able to walk about two hours if he needs to. He is able to climb  stairs, drive, works 40 hours. Is independent with his self-care. Denies  suicidal ideation. Does admit to some anxiety. No changes in review of  systems. Past medical, social, family history no changes since last visit.   PHYSICAL EXAMINATION:  Blood pressure 146/69, pulse 87, respirations 16, 97%  saturated on room air. He is a well-developed, well-nourished gentleman. No  apparent distress today. He is oriented x3. Affect bright, alert,  cooperative, talkative. Seems somewhat anxious.   Gait is normal. He is able to stand up quickly. Gait in the room, pain  behaviors are noted. Forward flexion, extension, lateral flexion he performs  without any pain behaviors. Reflexes are unchanged, symmetric, intact. Lower  extremities 5/5 strength in the lower extremities. Straight leg raise is  negative. No tenderness noted along the left knee with palpation along the  joint line, patellar tendon,  quadriceps insertion. No swelling noted. He did  have some complaints that he had some mild knee pain intermittently, but  this has been rather fleeting.   IMPRESSION:  1.  Degenerative disk disease, L3-4, L4-5.  2.  Facet arthropathy, L3-4, L4-5, L5-S1.  3.  Deconditioning/obesity.  4.  Poor sleep.  5.  Mild depression/anxiety.   PLAN:  Refill Neurontin 300 mg 1 p.o. q.8h. #90. Norco 7.5/325 mg 1 p.o. 5  times a day #160. Would like to decrease this over the next couple of months  if it is possible with him. Asked him to reduce the amount he is taking  during the day if he can. Lidoderm 5% 1 to 3 patches 12 hours on and 12  hours off #90. Effexor XR 75 one p.o. q.a.m. #30 with 2 refills. Will have  nursing staff seem him back in a month for a nursing visit. Refill his  medications. He will just need the Norco refilled, and I will see him back  in August. At that time, may be ordering him a physical therapy program to  help him train in light of his back problems.       DMK/MedQ  D:  11/20/2004 14:00:32  T:  11/21/2004 08:24:59  Job #:  [id number]

## 2010-10-23 NOTE — Assessment & Plan Note (Signed)
Mr. Boardley is a 60 year old, married, gentleman who is being followed  in our pain and rehabilitative clinic for chronic low back pain.   He is status post laminectomy by Dr. Lovell Sheehan in 2001 and has  intermittent flare ups of degenerative disc disease and lumbar  spondylosis.   He is back in today for a refill of his medications.   He has continued to stay quite active, working 40 hours a week.  He is  starting to play softball.   Continues to have some intermittent pain in the right low back region  and across the lower lumbar region.  It is about a 5 on a scale of 10.  Pain is intermittent and worse with sitting and twisting type  activities.  It improves with rest and medication.  Reports good relief  with current meds that he is on.   He is able to walk a half of an hour at a time.  He is a high  functioning 60 year old, independent with all of his self care and  working 40 hours a week and engaged in avocational activities after  work.   No changes in his past medical history, social or family history since  our last visit.   Exam, blood pressure is 124/68, pulse 87, respirations 17, 96% saturated  on room air.  He is mildly obese, well developed gentleman who appears  his stated age.   He did not appear in any distress and he is smiling.  His affect is  bright, alert, cooperative and pleasant.  Speech is clear, oriented x3  and follows commands without any problems.   Transitions for sit-to-stand quickly.  No pain behavior is appreciated.  His gait in the room is normal.  Tandem gait and Romberg test are  performed adequately.  He has good range of motion in his lumbar spine  with flexion and extension.  A little bit of discomfort with end range  movements.   Reflexes are symmetric and intact at the patella tendons and Achilles  tendons.  Motor strength is 5/5 and no focal deficits appreciated.  Sensory exam is intact as well.   IMPRESSION:  1. Lumbago.  2.  Status post laminectomy 2001 Dr. Lovell Sheehan.  3. Lumbar spondylosis.  4. Degenerative disc disease.  5. Mild depression, anxiety well controlled with Effexor 75 mg q. day.  6. History of insomnia.   PLAN:  1. We will refill the following medications for him today:  Neurontin      300 mg 3 times a day.  However, he will attempt to taper this done      over the next month.  2. We will continue Effexor 75 mg 1 p.o. q. day.  We will refill his      Prilosec 20 mg 1 p.o. q. day and we will refill his Norco 5/325 one      p.o. q.4 to 6 hours up to 5 per days #150.   He has been taking his medications as directed.  He displays no aberrant  behavior.  He is getting good relief with the medications and is able to  maintain a very active lifestyle.  We will see him back in 2 months.  Nursing visit next month.   He will be getting a 3 month supply of Prilosec, Neurontin and Effexor  and a 1 month supply of the Norco.           ______________________________  Brantley Stage, M.D.  DMK/MedQ  D:  09/29/2006 14:08:59  T:  09/29/2006 14:30:26  Job #:  [id number]

## 2010-10-23 NOTE — Assessment & Plan Note (Signed)
[name] is a 60 year old married gentleman who works full-time.  He is  back into our clinic for a brief recheck, refill of his medications.   His average pain is about a 3 on a scale of 10.  He is doing quite well with  his 5/325 mg Norco tablets up to five a day.   His back pain is described as fairly constant and dull in nature.  He gets  good relief with current medications.   He continues to work 40 hours a week.  He is also engaging in some exercise,  walking two to three times a week for 30-40 minutes at a time.   He is a high-functioning individual who is able to climb stairs.  He drives.  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   Denies problems controlling bowel or bladder.  Denies suicidal ideation.  Denies depression or anxiety   Nothing new in the review of systems.   Past medical, social, family history unchanged since last visit.   PHYSICAL EXAMINATION:  VITAL SIGNS:  Blood pressure 118/75, pulse is 84,  respirations 16, 96% saturated on room air.  GENERAL:  He is a well-developed, well-nourished gentleman who does not  appear in any distress.  He is oriented x3.  His affect is bright, alert and  cooperative.  He is pleasant.  MUSCULOSKELETAL/NEUROLOGIC:  He is able to stand up easily after being  seated.  He appears a little bit stiff initially, but his gait evens out  quite nicely.  He has a normal gait, able to walk on heels and toes.  Forward flexion and extension and lateral flexion are mildly limited, and he  reports some increased pain in that range.  Seated reflexes are symmetric  and intact in the lower extremities.  Straight leg raise is negative, and  5/5 strength is noted in the lower extremities as well.   IMPRESSION:  1.  Degenerative disk disease, L3-4, L4-5.  2.  Facet arthropathy, L3-4, L4-5, L5-S1.  3.  Deconditioned/obesity.  4.  Insomnia.  5.  Mild depression/anxiety.   PLAN:   Refill Norco 5/325 mg one p.o. q.4-6h., not more than five a day,  #150.  He has refills on his other medications and does not need any other  medications written for.  He is also taking Prilosec 20 mg daily, Lidoderm  p.r.n., Effexor 75 mg one p.o. q.a.m., and Neurontin 300 mg q.8h.  We will  see him back in two months.  He will follow up with nursing staff next month  for refill of his medications.  He has been stable.  Will continue to  encourage him to stay active, especially walking.  Would like to see him  walking four to five times a week for 30-40 minutes.   Incidentally, he also noted some right thigh intermittent and rare tingling,  no pain, no weakness associated with it, which is suspicious for right  lateral femoral cutaneous nerve irritation.  Discussed this with him today.  He will keep track further of when he gets these paresthesias in the right  lateral thigh.           ______________________________  Brantley Stage, M.D.     DMK/MedQ  D:  03/10/2005 12:43:57  T:  03/10/2005 14:26:38  Job #:  [id number]

## 2010-10-23 NOTE — Assessment & Plan Note (Signed)
MEDICAL RECORD NUMBER:  [id number].   DATE OF BIRTH:  Jan 02, 1951.   Edward Wilkerson is a 60 year old married gentleman who works at [employer].  He is  returning for a recheck in our pain and rehabilitative clinic.   He has a history of degenerative disk disease at L3-4 and L4-5 and facet  arthropathy at L3-4, L4-5 and L5-S1.   He was recently started on some Neurontin 300 mg at h.s.  He apparently is  doing quite well on this.  He is also taking Norco five tablets of 7.5/325  mg daily.  Apparently over the last several days he has taken more, going up  to six a day.  He ran out several days ago and appears to have some mild  withdrawal symptoms upon his visit today.   He has also been using Lidoderm.  They help somewhat.  He also was in a bit  of a flare up last visit.  He had been taken ibuprofen.  Apparently he has  done fairly well.   Back in today his prominent complaints of some nasal drainage, as well as  not sleeping well over the last few nights.  He may have had some fever and  chills.  He also reports some dizziness when he bends forward and comes back  up.  He has a variety of other symptoms.  He will be following up with his  primary care physician for this.  Given the fact that he had been taking  approximately 45 mg of hydrocodone and ran out suddenly the last few days,  as well as the increased nasal drainage and he does have a little bit of  tremulousness today poor sleep, I suspect he has also had some mild  withdrawal in addition to possibly sinus infection.   Overall he has been doing fairly well.  He is working 40 hours a week.  He  has no new problems with numbness, tingling, weakness or bowel or bladder  symptoms.   His function has been quite good.  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  He is typically up  three to four hours a day walking around.  He is able to drive.   Overall his sleep has  been fair, although the last three nights have been  poor for him.   He denies any suicidal ideation.  Denies harm to self or others.   Current medications include Allegra, Benicar, Neurontin 300 mg q.h.s. and  Norco 7.5/325 mg five times a day, although he admits to increasing the  amount he has taken over the last couple of weeks and thereby running out  about three days ago.  He is on Lidoderm, tolerating that well, and  Prilosec.   His pain is typically located in the lower lumbar area.  It radiates down  the back of both legs intermittently.  Most of his pain is, however, in the  low back.  He describes it on average about a 6 on a scale of 10.  It can go  up to an 8, however.  Again, no new neurologic symptoms.   On exam, his blood pressure is 154/90, pulse 106, respirations 20 and 99%  saturated on room air.  He is alert, oriented and a little more irritable  than I have seen him at the last couple of visits.  Otherwise he is  cooperative and overall in good spirits.  He does appear to have some  increased nasal drainage today.  He is able to stand up easily.  Gait in the  room is normal.  He is able to flex forward and extend back.  Reports some  increased pain at end range.  He has some limitations with forward flexion  and extension, although he is moving quite well today.   Seated reflexes are 2+ at the knees and ankles.  The toes are downgoing.  No  clonus is noted.  Motor strength is excellent, 5/5 at hip flexors, knee  extensors, dorsiflexors, plantar flexors and EHL.  Straight leg raise is  negative.  No sensory deficits on exam as well.  No increased tenderness  over the lumbar spine.   IMPRESSION:  1.  Degenerative disk disease at L3-4 and L4-5.  2.  Facet arthropathy at L3-4, L4-5 and L5-S1.  3.  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  4.  General decondition and weight gain.  5.  Poor sleep gain.  6.  Mild withdraw symptoms noted today.   7.  Probable mild depression.   PLAN:  Will increase Neurontin from 300 mg q.h.s. to 300 mg three times a  day.  Will refill his Norco 75/325 mg one p.o. q.4-6h., #150 given.  He does  not need more Lidoderm at this time.  Will also have him follow up with his  primary care physician for his other symptoms that he is having today,  including the dizziness and rule out sinus infection.  Will see him back in  one month.  Will have him continue his physical therapy program for  strengthening, conditioning and flexibility.  Will consider a TENS unit for  him as well.  Also in the future may discuss facet injections.  At the next  visit would also like to start him on some Effexor once I know he is  tolerating the Neurontin well and he has been checked by his primary care  doctor.  Will then see him back in one month.       DMK/MedQ  D:  03/27/2004 12:00:42  T:  03/27/2004 13:41:11  Job #:  [id number]

## 2010-10-23 NOTE — Assessment & Plan Note (Signed)
Edward Wilkerson is a 60 year old married white gentleman who is being seen in  our pain and rehabilitative clinic for chronic low back pain, overall  deconditioning and weight gain, poor sleep, mild depression.   He reports that, over the last month, he is disappointed in himself in that  he has not been exercising as he would have liked to and has not been doing  as well on his diet.   His back is aching in the low lumbar area.  He describes the average pain at  about a 5 on a scale to 10.  It moderately interferes with his activity and  enjoyment of life.  Pain is worse in the morning.  He gets fairly good  relief with his pain medications and appears to be fairly happy with our  current pain management program for him.   He reports he has probably overdone it and has not paced himself like he  should have over the last month.  He had 27 people at his house last week;  13 people stayed for three nights.  He had family members up from another  state.  He did get a TENS unit, is not sure if it is terribly helpful at  this point.  He had been using Lidoderm, Neurontin, and Norco as well as  Effexor and is tolerating all of these medications fairly well.   Mobility, patient walks about 20 to 25 minutes a day for exercise.  He does  not require any assistive device.  He drives and is able to climb stairs.   He works 40 hours a week.  He is an [employer] production planner.   He reports some occasional tingling and dizziness, nothing that is getting  in the way of his function at this point.   On physical exam, blood pressure 130/91, pulse 84, respirations 14, 100%  saturation on room air.  He is a well-developed, well-nourished gentleman in  no apparent distress.  He is oriented x 3.  Affect is overall bright and  alert.  He is able to get out of the chair without any difficulty.  Gait is  normal.  He has some limitations with forward flexion; however, overall his  mobility in his lumbar spine is fairly  good.  He moves fluidly.  Reflexes  are 2+ at the knees and ankles.  Toes are downgoing.  No clonus is noted.  Excellent strength in lower extremities 5/5 and hip flexors, knee extensors,  dorsiflexors, plantar flexors, EHL, Romberg test negative.  Mild tenderness  in lumbar paraspinal musculature.   IMPRESSION:  1.  Degenerative disk disease L3-4, L4-5.  2.  Facet arthropathy L3-4, L4-5, L5-S1.  3.  Right L5 peroneal fibrosis noted on MRI scan in the past.  4.  Overall deconditioning and weight gain.  5.  Poor overall sleep.  6.  Mild depression.   PLAN:  1.  Will refill Narco 7.5/325 one p.o. q.4-6h., #150 given.  He takes      approximately 5 a day.  2.  Lidoderm 5% 1 to 3 patches, 4 hours on, 2 hours off, #90.  3.  He is also taking Effexor 75 mg 1 p.o. daily.  He does not require a      prescription.  Apparently his daughter works for [employer] which makes      Effexor, and he has been able to get samples from her.  4.  We will see him back in one month.  5.  I have encouraged him to continue his exercise program, use TENS unit as      needed.       DMK/MedQ  D:  05/20/2004 18:16:12  T:  05/20/2004 20:22:57  Job #:  [id number]

## 2010-10-23 NOTE — H&P (Signed)
[place]  Patient:    Edward Wilkerson, Edward Wilkerson                     MRN: [id number] Adm. Date:  11/19/99 Attending:  Cristi Loron, M.D. Dictator:   Cristi Loron, M.D.                         History and Physical  CHIEF COMPLAINT:  Right leg pain.  HISTORY OF PRESENT ILLNESS:  The patient is a 60 year old white male who has had chronic intermittent lumbago for approximately 20 years.  He has been seen by several orthopedic surgeons and chiropractors, etc. in the past, and has always got by okay.  Approximately six weeks ago he began having increasing low back pain after playing softball.  It initially was primarily low back pain, but then in the last three weeks it became incapacitating right leg pain.  He has been treated with ibuprofen, vicoprofin, Vioxx, etc.  He was started on oxycodone and it did not help, and he was put on a Duragesic patch. An MRI scan was obtained that demonstrated herniated disc at L4-5.  The patient complains of pain radiating down his right leg and some weakness in his right leg.  He has had no ataxia or incontinence.  PAST MEDICAL HISTORY: 1. Remote history of inguinal hernia. 2. Appendicitis.  PAST SURGICAL HISTORY: 1. Inguinal herniorrhaphy in 1977. 2. Appendectomy in 1991.  MEDICATIONS:  Phentanyl patch 50 mcg per hour.  ALLERGIES:  SULFA.  FAMILY HISTORY:  The patients mother died at age 89 secondary to hypertension and diabetes.  The patients father had an myocardial infarction at age 38 and died.  SOCIAL HISTORY:  The patient is married.  He lives in [place].  He has two children.  He is employed as an Print production planner for [employer].  He occasionally smokes cigars.  He drinks alcohol socially, and denies drug use.  REVIEW OF SYSTEMS:  Negative except as above.  PHYSICAL EXAMINATION:  GENERAL:  A pleasant 60 year old white male in obvious distress complaining of right leg pain.  VITAL SIGNS:   Height 6 feet, weight 220 pounds.  HEENT:  Normocephalic, atraumatic.  Pupils are equal, round and reactive to light.  Extraocular movements intact.  Sclerae white.  Conjunctivae pink. Oropharynx benign.  Uvula midline.  NECK:  Supple, there are no masses, meningismus, deformities, tracheal deviation, jugular venous distension, carotid bruits.  He has normal cervical range of motion.  Spurlings test is negative.  Lhermittes sign was not present.  Thorax is symmetric.  LUNGS:  Clear to auscultation without rales, rhonchi, or wheezes.  HEART:  Regular rate and rhythm.  ABDOMEN:  Soft and nontender, obese.  EXTREMITIES:  No obvious deformities.  BACK:  There is no point tenderness or deformities.  Straight leg raise testing is positive on the right, negative on the left.  Tabors testing negative bilaterally.  NEUROLOGIC:  The patient is alert and oriented x 3.  Cranial nerves II-XII grossly intact bilaterally.  Visual and hearing are grossly normal bilaterally.  Motor strength is 5/5 in bilateral deltoids, biceps, triceps, wrist extensors, hand grips, and ossea psoas, quadriceps, gastrocnemius, and left extensor hallucis longus.  He has some slight weakness of the right extensor hallucis longus, i.e. 4+/5.  Sensory examination demonstrates some slightly decreased pinprick sensation at the right L5 distribution, otherwise normal.  Deep tendon reflexes are 2/4 in his bilateral biceps, triceps,  quadriceps, gastrocnemius, 1/4 brachial radialis.  Cerebellar examination is intact to rapid alternating movements of the upper extremities bilaterally.  IMAGING STUDIES:  The patients lumbar MRI was performed at [facility] on 11/04/99.  He has degenerative disc disease at L3-4 and L4-5, has a right-sided herniated nucleus pulposus at L4-5 compressing the right L5 nerve root.  ASSESSMENT AND PLAN:  L4-5 degenerative disc disease, herniated nucleus pulposus, lumbago, lumbar  radiculopathy.  I have discussed the surgery with the patient and his wife, reviewed the MRI scan with them pointing out the abnormalities.  He clearly suffers from right L5 radiculopathy based on the signs, symptoms, and physical examination.  I discussed the various treatment options with them, including doing nothing, continued medical management, and surgery.  I described the procedure as an L4-5 microdiskectomy.  I have shown them surgical models.  I have discussed the risks of surgery extensively.  The patient has weighed the risks, benefits, and alternatives to surgery, and would like to proceed with a right L4-5 microdiskectomy on 11/19/99. DD:  11/19/99 TD:  11/19/99 Job: [id number]

## 2010-10-23 NOTE — Assessment & Plan Note (Signed)
FOLLOW UP:  Mr. Laur is a 60 year old married white gentleman [facility] for chronic low back pain. He has a history of  degenerative disc disease and facet arthropathy.   He is back in today. His pain has been controlled with Norco taking 7.5/325  mg up to 5 pills a day as well as Neurontin 300 mg t.i.d., Lidoderm and  Effexor.   He reports good relief with his pain medications. His average pain is about  a 6 on a scale of 10. His sleep is fair. Pain is exacerbated by prolonged  sitting and twisting type activities. His pain improves with rest and  medication.   He has another incidental problem. His right index finger at the PIP joint.  He has some intermittent achiness that has not been persistent, a couple of  times over the last month. He recalls an old injury in 1977 catching a  baseball where he had a fracture possibly through that joint. He wore a  splint for a while after that.   Functionally, he has been going to the gym twice a week. He walks about two  miles each time and then after walking rides the bike for 10 to 15 minutes.  He is employed 40 hours a week. He is able to climb stairs. He is able to  drive. He does not need any assistance with activities of daily living.  Denies any problems with bowel or bladder. Denies anxiety. Denies suicidal  thoughts. Admits to some depression intermittently.   No new changes in review of systems. No new changes in past medical history,  social, or family history.   PHYSICAL EXAMINATION:  VITAL SIGNS:  Blood pressure 134/88, pulse 87,  respirations 20. Saturating 95% on room air.  GENERAL:  He is a well-developed, mildly obese white gentleman. In no  apparent distress during our interview. Affect is overall bright, alert, and  cooperative. He is oriented x 3.  MUSCULOSKELETAL:  He is able to stand independently from his seated  position. His gait is entirely normal. He has fairly good range of motion  with forward flexion, extension, lateral flexion.   IMPRESSION:  1.  Degenerative disc disease at lumbar vertebrae-3/4 and lumbar vertebrae-      4/5.  2.  Facet arthropathy at lumbar vertebrae-3/4, lumbar vertebrae-4/5, and      lumbar verterbrae-5/sacral vertebrae-1.  3.  Right lumbar vertebrae-5 paraneural fibrosis noted on MRI in the past.  4.  Deconditioned and obesity.  5.  Poor overall sleep. Has improved over the last month.  6.  Mild depression/anxiety. Currently taking Effexor. Appears to be working      well. He has not had any complaints of anxiety or depression at this      visit.   PLAN:  We will refill the following medications for him: Lidoderm 5% with 1-  3 patches with 12 hours and 12 hours off, Neurontin 300 mg 1 p.o. q.8h. #90.  He has samples of Effexor from his daughter which he has been taking 75 mg  XR and he has recently had his Norco filled on July 16, 2004. We will see  him back then in one month. We will continue to encourage him to exercise  and work on weight loss. Overall, he appears to be doing quite well with  mood and engaging in appropriate exercise and activity. We will see him back  in a month.      DMK/MedQ  D:  07/23/2004 12:28:20  T:  07/23/2004 12:53:46  Job #:  [id number]

## 2010-10-23 NOTE — Op Note (Signed)
[place]  Patient:    Edward Wilkerson, Edward Wilkerson                      MRN: [id number] Proc. Date: 11/19/99 Adm. Date:  [id number] Disc. Date: [id number] Attending:  Tressie Stalker D                           Operative Report  PREOPERATIVE DIAGNOSIS:  Right L4-5 herniated nucleus pulposus, degenerative disk disease, spinal stenosis, and lumbar radiculopathy.  POSTOPERATIVE DIAGNOSIS:  Right L4-5 herniated nucleus pulposus, degenerative disk disease, spinal stenosis, and lumbar radiculopathy.  PROCEDURE:  Right L4-5 microdiskectomy using microdissection.  SURGEON:  Cristi Loron, M.D.  ASSISTANT:  Alanson Aly. Roxan Hockey, M.D.  ANESTHESIA:  General endotracheal.  ESTIMATED BLOOD LOSS:  Less than 100 cc.  SPECIMENS:  None.  DRAINS:  None.  COMPLICATIONS:  None.  BRIEF HISTORY:  The patient is a 60 year old white male who suffered from several months of back and predominantly right leg pain.  He failed medical management and was worked up as an outpatient with a lumbar MRI that demonstrated a herniated nucleus pulposus of L5-S1 on the right.  The patients signs and symptoms were consistent with L5 radiculopathy.  He therefore weighed the risks, benefits, and alternatives of surgery and decided to proceed with a microdiskectomy.  DESCRIPTION OF PROCEDURE:  The patient was brought to the operating room by the anesthesia team.  General endotracheal anesthesia was induced.  The patient was turned to the prone position on the Wilson frame.  The patients lumbosacral region was then shaved and prepared with Betadine scrub and Betadine solution, and sterile drapes were applied.  I then injected the area to be incised with Marcaine with epinephrine solution.  I used the scalpel to make a vertical incision over the L4-5 interspace.  I used electrocautery to dissect down to the thoracolumbar fascia and divided the fascia just to the right of the midline,  and performed a right-sided subperiosteal dissection, stripping the paraspinous musculature from the right spinous process and lamina of L4 and L5.  I inserted the McCullough retractor for exposure.  I obtained an intraoperative radiograph to confirm my location.  I then brought the operating microscope into the field and under instant magnification and illumination, I completed the decompression.  I used the Midas Rex high speed drill to perform a left L4 laminotomy, drilling until I encountered the insertion of the ligamentum flavum.  I then widened the laminotomy and removed the right L4-5 ligamentum flavum with the Kerrison punch.  I identified the underlying thecal sac and then performed a foraminotomy about the right L5 nerve root.  I then used microdissection to free the thecal sac and the L5 nerve root from the epidural tissue and the underlying large L4-5 intervertebral disk.  This was a free fragment disk herniation which appeared much worse than on MRI scan.  I removed the free fragment of disk herniation and several fragments using the pituitary forceps.  I then probed about the annulus fibrosis and noted that this was bulging somewhat, but clearly it was not compressing either the thecal sac or the L5 nerve root, so I decided not to enter into the intervertebral disk space.  I did palpate about the lesser surface of the thecal sac and the exit of the L5 nerve root, and noted that it was well-decompressed.  I used electrocautery to coagulate the posterior  surface of the annulus fibrosis.  I then copiously irrigated the wound out with Bacitracin solution.  I then removed the [facility] retractor, and reapproximated the patients thoracolumbar fascia with interrupted #1 Vicryl, the subcutaneous tissue with interrupted 2-0 Vicryl, and the skin with Steri-Strips and ________.  The wound was then coated with Bacitracin ointment and a sterile dressing applied.  The drapes were removed  and the patient was subsequently extubated by the anesthesia team and transported to the postanesthesia care unit in stable condition.  Final sponge, instrument, and needle counts were correct at the end of this case. DD:  11/19/99 TD:  11/24/99 Job: [id number]

## 2010-10-23 NOTE — Assessment & Plan Note (Signed)
DATE OF SERVICE:  September 23, 2004.   MEDICAL RECORD NUMBER:  [id number].   DATE OF BIRTH:  Dec 07, 1950.   [name] is a 60 year old, married, white gentleman who is being seen in  our pain and rehabilitative clinic for chronic low back pain.  He has a  history of degenerative disk disease, as well as facet arthropathy,  deconditioning, obesity, mild depression and anxiety.   He is back in.  Reports his average pain about a 6 on a scale of 10, located  in the low back area.  It radiates somewhat down the right posterior thigh.  The pain is described as sharp, burning and dull intermittently.  The pain  is worse in the morning.  Sleep is overall fair.  He gets good relief with  his pain medications currently.  The pain is exacerbated by sitting and  improves with medications.   The patient is independent with his mobility.  Able to climb stairs and  drive.  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  Denies problems  controlling bowel or bladder.  Denies depression, anxiety or suicidal  thoughts at this time.   REVIEW OF SYSTEMS:  Positive for weight gain, otherwise negative.   Past medical, social and family history unchanged since last visit.   PHYSICAL EXAMINATION:  Blood pressure 141/88, pulse 103, respirations 16,  95% saturated on room air.  Mr. Kolton is a well-developed, well-nourished  gentleman.  He is oriented x 3.  Affect is overall bright, alert and  talkative.  He is able to stand independently from a seated position.  Gait  is nonantalgic.  He has mild limitations in forward flexion and extension.  Seated reflexes are symmetric and intact.  Motor strength 5/5 in the lower  extremities.   IMPRESSION:  1.  Degenerative disk disease at L3-4 and L4-5.  2.  Facet arthropathy at L3-4, L4-5 and L5-S1.  3.  Deconditioned/obesity.  4.  Overall poor sleep, which is actually improved.  5.  Mild depression and anxiety appear to be improving since on Effexor.   PLAN:  We refilled the following medications today:  Neurontin 300 mg one  p.o. q.8h. #90, as well as Norco 7.5/325 mg one p.o. up to five times per  day #150.  He continues to take Effexor 75 mg one p.o. daily.  He may not be  able to get samples any more.  May to start refilling this for him at next  visit.  Will see him back in a month.  He has been stable.  He continues to  follow through with his exercise program.  He has joined [employer] and  lost about 6-1/2 pounds.  Sleep characteristics have overall improved and  overall improvement in depression and anxiety noted as well.     DMK/MedQ  D:  09/23/2004 15:43:31  T:  09/23/2004 17:25:07  Job #:  [id number]

## 2010-10-23 NOTE — Assessment & Plan Note (Signed)
Edward Wilkerson is a 60 year old white married gentleman who is being seen  in our pain and rehabilitative clinic for chronic low back pain.   He is status post a laminectomy back in 2001 by Dr. Lovell Sheehan and has had  intermittent flare-ups and he has known degenerative disc disease and  lumbar spondylosis.   He is back in requesting a refill of his medications.  He states his  average pain is down to about a 3 on a scale of 10.  He has had some  areas over the left thoracic paraspinal musculature which have bothered  him off and on over the last couple of weeks.  He attributes it possibly  to playing basketball or working out at [employer].   This is not associated with any fever or chills or changes in bowel  habits or urinary habits and his blood pressure has been fairly well-  controlled as well.   Pain is typically worse for him in the morning.  He sleeps fair.  He  reports good relief with the meds that he is on.  He is a high  functioning gentleman working 40 hours a week, independent with all of  his self care.   REVIEW OF SYSTEMS:  Negative.   No changes in past medical, social or family history.   MEDICATIONS:  1. Neurontin 300 mg 3x a day.  2. Effexor 75 mg one p.o. q.a.m.  3. Prilosec 20 mg one p.o. daily.  4. Norco 5/325 one p.o. q.4-6h. up to 5x per day.  5. Naprosyn on a p.r.n. basis.   PHYSICAL EXAMINATION:  Today blood pressure is 107/74, pulse 91,  respirations 16, 96% saturating on room air.  He is a well-developed,  mildly obese gentleman who appears his stated age.  He does not appear  in any distress.  His affect is bright, alert.  He is cooperative and  pleasant.  He is oriented x3. His speech is clear. He follows commands  without any difficulty.  He transitions from sit to stand easily and  independently in the room. His gait is normal.  Tandem gait, Romberg  tests are all within normal limits.  He has limitations in lumbar motion  especially with extension.  Reflexes are symmetric and intact in the  lower extremities.  Motor strength is 5/5.  Straight leg raise is  negative.  No sensory deficits are appreciated.   IMPRESSION:  1. Lumbago.  2. Status post laminectomy in 2001 by Dr. Lovell Sheehan.  3. Lumbar spondylosis.  4. Degenerative disc disease.  5. Mild depression/anxiety.  6. History of insomnia.   PLAN:  We will refill Mr. Mansel Strother 5/325 one p.o. q.i.d. and h.s.  p.r.n. pain in the low back, #150.  He does not need refills today on  his Lidoderm, his Effexor or his Neurontin and he still has some  Naprosyn left.   We discussed long-term plans with Mr. Allman regarding his medications  and his functional status.  He has been doing quite well. He is engaging  in an exercise program where he is walking or running 3-4 times a week  at least 2-3 miles.  He is interested in eventually decreasing the  amount of hydrocodone he is taking.  We will discuss this  further at the next visit, possibly transitioning him to Ultram and over-  the-counter Acetaminophen at some point.  We will see him back in a  month.  ______________________________  Brantley Stage, M.D.     DMK/MedQ  D:  07/06/2006 13:16:23  T:  07/06/2006 13:39:24  Job #:  [id number]

## 2010-11-12 ENCOUNTER — Telehealth: Payer: Self-pay | Admitting: Internal Medicine

## 2010-11-12 MED ORDER — HYDROCODONE-ACETAMINOPHEN 7.5-750 MG PO TABS: 1.0000 | ORAL_TABLET | Freq: Four times a day (QID) | ORAL | Status: DC | PRN

## 2010-11-12 NOTE — Telephone Encounter (Signed)
40, no RF

## 2010-12-14 ENCOUNTER — Other Ambulatory Visit: Payer: Self-pay | Admitting: Internal Medicine

## 2010-12-14 NOTE — Telephone Encounter (Signed)
duplicate

## 2010-12-14 NOTE — Telephone Encounter (Signed)
Ok #30, no RF 

## 2010-12-23 ENCOUNTER — Other Ambulatory Visit: Payer: Self-pay | Admitting: Internal Medicine

## 2010-12-23 MED ORDER — ATORVASTATIN CALCIUM 20 MG PO TABS: 20.0000 mg | ORAL_TABLET | Freq: Every day | ORAL | Status: DC

## 2010-12-23 NOTE — Telephone Encounter (Signed)
Sent in

## 2010-12-30 ENCOUNTER — Other Ambulatory Visit: Payer: Self-pay | Admitting: Internal Medicine

## 2010-12-30 MED ORDER — ATORVASTATIN CALCIUM 20 MG PO TABS: 20.0000 mg | ORAL_TABLET | Freq: Every day | ORAL | Status: DC

## 2010-12-31 ENCOUNTER — Ambulatory Visit (INDEPENDENT_AMBULATORY_CARE_PROVIDER_SITE_OTHER): Payer: BC Managed Care – PPO | Admitting: Internal Medicine

## 2010-12-31 ENCOUNTER — Other Ambulatory Visit: Payer: Self-pay | Admitting: Internal Medicine

## 2010-12-31 ENCOUNTER — Ambulatory Visit: Payer: BC Managed Care – PPO | Admitting: Internal Medicine

## 2010-12-31 ENCOUNTER — Encounter: Payer: Self-pay | Admitting: Internal Medicine

## 2010-12-31 DIAGNOSIS — I10 Essential (primary) hypertension: Secondary | ICD-10-CM

## 2010-12-31 DIAGNOSIS — E119 Type 2 diabetes mellitus without complications: Secondary | ICD-10-CM

## 2010-12-31 DIAGNOSIS — M549 Dorsalgia, unspecified: Secondary | ICD-10-CM

## 2010-12-31 DIAGNOSIS — E785 Hyperlipidemia, unspecified: Secondary | ICD-10-CM

## 2010-12-31 MED ORDER — HYDROCODONE-ACETAMINOPHEN 7.5-750 MG PO TABS: 1.0000 | ORAL_TABLET | Freq: Four times a day (QID) | ORAL | Status: DC | PRN

## 2010-12-31 MED ORDER — CYCLOBENZAPRINE HCL 10 MG PO TABS: 10.0000 mg | ORAL_TABLET | Freq: Two times a day (BID) | ORAL | Status: DC | PRN

## 2010-12-31 MED ORDER — OLMESARTAN MEDOXOMIL-HCTZ 40-25 MG PO TABS: 1.0000 | ORAL_TABLET | Freq: Every day | ORAL | Status: DC

## 2010-12-31 NOTE — Assessment & Plan Note (Addendum)
Life style improved, checking a  FLP, see instructions. Will RF  medications with results

## 2010-12-31 NOTE — Patient Instructions (Addendum)
Came back fasting tomorrow: FLP---dx hyperlipidemia A1C ---dx DM BMP--dx HTN

## 2010-12-31 NOTE — Assessment & Plan Note (Signed)
Refer to [facility]  orthopedic. Refill Vicodin and Flexeril. Flexeril does cause some mouth dryness

## 2010-12-31 NOTE — Progress Notes (Signed)
  Subjective:    Patient ID: Edward Wilkerson, male    DOB: 03-31-1951, 60 y.o.   MRN: [id number]  HPI Routine office visit Still has backache, would like a refill on Vicodin. Also, was referred to a specialist the last time but that didn't work. Needs a new  referral  Past Medical History  Diagnosis Date  . DM type 2 (diabetes mellitus, type 2)     bordeline  . Sleep apnea     could'nt tol CPAP (has a machine)   . Hypertension   . Hyperlipidemia   . Depression   . 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 RBBB (right bundle branch block with left anterior fascicular block) 2009    neg stress ECHO, saw cards   Past Surgical History  Procedure Date  . Inguinal hernia repair 1977  . Appendectomy 1991  . Lumbar laminectomy 2001    Dr.Jenkins, still has mild pain  . Shoulder surgery 07/2008    (Right)     Review of Systems As far as the diet and exercise, diet has improved significantly, both in quantity and quality. has lost 6 pounds in 4 months. Still needs to exercise more. Good medication compliance with all meds without apparent side effects. Only the Flexeril seems to dry his mouth a little.      Objective:   Physical Exam  Constitutional: He appears well-developed and well-nourished. No distress.  Cardiovascular: Normal rate, regular rhythm and normal heart sounds.   No murmur heard. Pulmonary/Chest: Effort normal and breath sounds normal. No respiratory distress. He has no wheezes. He has no rales.  Musculoskeletal: He exhibits no edema.  Skin: He is not diaphoretic.          Assessment & Plan:

## 2010-12-31 NOTE — Assessment & Plan Note (Signed)
At goal.  

## 2010-12-31 NOTE — Assessment & Plan Note (Signed)
Due for a  A1c, doing great with lifestyle, praised !

## 2011-01-01 ENCOUNTER — Other Ambulatory Visit (INDEPENDENT_AMBULATORY_CARE_PROVIDER_SITE_OTHER): Payer: BC Managed Care – PPO

## 2011-01-01 DIAGNOSIS — I10 Essential (primary) hypertension: Secondary | ICD-10-CM

## 2011-01-01 DIAGNOSIS — E785 Hyperlipidemia, unspecified: Secondary | ICD-10-CM

## 2011-01-01 LAB — LIPID PANEL
HDL: 56.8 mg/dL (ref >39.00)
LDL Cholesterol: 74 mg/dL (ref 0–99)
Total CHOL/HDL Ratio: 3
VLDL: 13.2 mg/dL (ref 0.0–40.0)

## 2011-01-01 LAB — BASIC METABOLIC PANEL
Calcium: 9.1 mg/dL (ref 8.4–10.5)
Chloride: 108 mEq/L (ref 96–112)
Creatinine, Ser: 1 mg/dL (ref 0.4–1.5)
GFR: 83.97 mL/min (ref >60.00)

## 2011-01-01 LAB — HEMOGLOBIN A1C: Hgb A1c MFr Bld: 6 % (ref 4.6–6.5)

## 2011-01-01 NOTE — Progress Notes (Signed)
2 Labs only

## 2011-01-01 NOTE — Progress Notes (Signed)
Labs only

## 2011-01-05 ENCOUNTER — Telehealth: Payer: Self-pay | Admitting: *Deleted

## 2011-01-05 MED ORDER — ATORVASTATIN CALCIUM 20 MG PO TABS: 20.0000 mg | ORAL_TABLET | Freq: Every day | ORAL | Status: DC

## 2011-01-05 MED ORDER — OLMESARTAN MEDOXOMIL-HCTZ 40-25 MG PO TABS: 1.0000 | ORAL_TABLET | Freq: Every day | ORAL | Status: DC

## 2011-01-05 NOTE — Telephone Encounter (Signed)
Message copied by Leanne Lovely on Tue Jan 05, 2011  9:45 AM ------      Message from: Willow Ora E      Created: Mon Jan 04, 2011  9:52 PM       Advise patient:      cholesterol is great, A1C stable at 6.0; good results , RF lipitor and benicar (90 days , 2 RF)

## 2011-01-05 NOTE — Telephone Encounter (Signed)
Message left for patient to return my call.  

## 2011-01-06 NOTE — Telephone Encounter (Signed)
Message left for patient to return my call.  

## 2011-01-07 NOTE — Telephone Encounter (Signed)
Message left for patient to return my call.  

## 2011-01-08 NOTE — Telephone Encounter (Signed)
Message left for patient to return my call.  

## 2011-01-13 MED ORDER — OLMESARTAN MEDOXOMIL-HCTZ 40-25 MG PO TABS: 1.0000 | ORAL_TABLET | Freq: Every day | ORAL | Status: DC

## 2011-01-13 MED ORDER — ATORVASTATIN CALCIUM 20 MG PO TABS: 20.0000 mg | ORAL_TABLET | Freq: Every day | ORAL | Status: DC

## 2011-01-13 NOTE — Telephone Encounter (Signed)
Spoke w/ pt aware of labs and medication sent to pharmacy

## 2011-01-28 ENCOUNTER — Other Ambulatory Visit: Payer: Self-pay | Admitting: Internal Medicine

## 2011-01-28 NOTE — Telephone Encounter (Signed)
Request Hydrocodone-APAP 7.5-750 mg [last refill 12/31/10 #60x0]

## 2011-01-29 ENCOUNTER — Ambulatory Visit (INDEPENDENT_AMBULATORY_CARE_PROVIDER_SITE_OTHER): Payer: BC Managed Care – PPO | Admitting: Internal Medicine

## 2011-01-29 ENCOUNTER — Ambulatory Visit: Payer: BC Managed Care – PPO | Admitting: Internal Medicine

## 2011-01-29 ENCOUNTER — Encounter: Payer: Self-pay | Admitting: Internal Medicine

## 2011-01-29 VITALS — BP 130/78 | HR 96 | Wt 254.0 lb

## 2011-01-29 DIAGNOSIS — E119 Type 2 diabetes mellitus without complications: Secondary | ICD-10-CM

## 2011-01-29 DIAGNOSIS — M545 Low back pain: Secondary | ICD-10-CM

## 2011-01-29 MED ORDER — HYDROCODONE-ACETAMINOPHEN 7.5-750 MG PO TABS: 1.0000 | ORAL_TABLET | Freq: Four times a day (QID) | ORAL | Status: DC | PRN

## 2011-01-29 MED ORDER — CYCLOBENZAPRINE HCL 10 MG PO TABS: 10.0000 mg | ORAL_TABLET | ORAL | Status: DC

## 2011-01-29 NOTE — Progress Notes (Signed)
Subjective:    Patient ID: Edward Wilkerson, male    DOB: [birth year], 60 y.o.   MRN: [id number]  HPI BACK  PAIN: Location: LS bilaterally   Onset: 01/25/2011   Severity: up to 10 Pain is described as: sharp  Worse with: with prolonged driving; he traveled  to [place] , [place]  8/19    Better with: "dulling with muscle relaxants & pain meds"  Pain radiates to: none   Impaired range of motion: yes History of repetitive motion:  no  History of overuse or hyperextension:  no  History of trauma:  no   Past history of similar problem:  yes, intermittent since 2001(see below) Symptoms Numbness/tingling:  no  Weakness:  no Red Flags Fever:  no  Headache:  no  Bowel/bladder dysfunction:    PMH of surgery for bulging disc 2001 @ L4-5, Dr Lovell Sheehan, [place]      Review of Systems he denies dysuria, hematuria, or pyuria. FBS not checked; no hypoglycemia     Objective:   Physical Exam Gen.: in no distress but very uncomfortable Lungs: Normal respiratory effort; chest expands symmetrically. Lungs are clear to auscultation without rales, wheezes, or increased work of breathing. Heart: Normal rate and rhythm. Normal S1 and S2. No gallop, click, or rub. S4 w/o murmur. Abdomen: Bowel sounds normal; abdomen soft and nontender. No masses, organomegaly or hernias noted. No AAA.                                                                                  Musculoskeletal/extremities: No deformity or scoliosis noted of  the thoracic or lumbar spine. No clubbing, cyanosis, edema, or deformity noted. Range of motion  Decreased with SLR , only to 45 degrees .Tone & strength  normal.Joints normal. Nail health  good. There is no radiation of pain into hs legs  with straight leg raising. He exhibits the classic "low back crawl" when lying back in rising from the exam table Vascular:  dorsalis pedis and  posterior tibial pulses are full and equal. No bruits present. Neurologic: Alert and oriented x3. Deep tendon  reflexes symmetrical and normal. He walks deliberately with the spine held straight avoiding any flexion. Heel and toe walking is completed well without footdrop.          Skin: Intact without suspicious lesions or rashes. The operative site in the lumbar area is well healed. There is no zoster rash. Lymph: No cervical, axillary  lymphadenopathy present. Psych: Mood and affect are normal. Normally interactive                                                                                         Assessment & Plan:  #1 classic acute low back pain with secondary muscle spasm. No acute disc issue is suggested clinically.  Plan: See orders and  recommendations.

## 2011-01-29 NOTE — Patient Instructions (Addendum)
The best exercises for the low back include freestyle swimming, stretch aerobics, and yoga.  Eat a low-fat diet with lots of fruits and vegetables, up to 7-9 servings per day. Avoid obesity; your goal is waist measurement < 40 inches.Consume less than 40 grams of sugar per day from foods & drinks with High Fructose Corn Sugar as #1,2,3 or # 4 on label. Follow the low carb nutrition program in [employer] as closely as possible to prevent Diabetes progression & complications. White carbohydrates (potatoes, rice, bread, and pasta) have a high spike of sugar and a high load of sugar. For example a  baked potato has a cup of sugar and a  french fry  2 teaspoons of sugar. Yams, wild  rice, whole grained bread &  wheat pasta have been much lower spike and load of  sugar. Portions should be the size of a deck of cards or your palm.

## 2011-01-29 NOTE — Telephone Encounter (Signed)
Seen Dr Alwyn Ren 01/29/11. Rx Done.

## 2011-01-29 NOTE — Telephone Encounter (Signed)
Ok 40, 1 RF

## 2011-02-22 ENCOUNTER — Telehealth: Payer: Self-pay | Admitting: Internal Medicine

## 2011-02-22 DIAGNOSIS — M545 Low back pain: Secondary | ICD-10-CM

## 2011-02-22 NOTE — Telephone Encounter (Signed)
Pt last seen 12/31/10; rx last filled 01/29/11 #60 with 0 rf.  Pls advise.

## 2011-02-22 NOTE — Telephone Encounter (Signed)
Was referred to ortho, has he been seen already? Ok call 40, no RF

## 2011-02-23 MED ORDER — HYDROCODONE-ACETAMINOPHEN 7.5-750 MG PO TABS: 1.0000 | ORAL_TABLET | Freq: Four times a day (QID) | ORAL | Status: DC | PRN

## 2011-02-23 NOTE — Telephone Encounter (Signed)
Please refer to another orthopedic doctor

## 2011-02-23 NOTE — Telephone Encounter (Signed)
rx called in to pharmacy.  Called and spoke with pt and he states that Dr. Ethelene Wilkerson refused to see him and would like to know if you have any other suggestions.  Pls advise.

## 2011-02-24 NOTE — Telephone Encounter (Signed)
Thank you :)

## 2011-02-24 NOTE — Telephone Encounter (Signed)
As an FYI, patient was already referred to [facility] in July-2012, and per fax from Mandi[facility] on 02-01-11, they attempted to reach patient multiple times, and left several messages for patient to call back to schedule an appointment.  Patient never responded to them.  I am RE-referring to [facility] again today per phone note.

## 2011-03-31 ENCOUNTER — Other Ambulatory Visit: Payer: Self-pay | Admitting: Internal Medicine

## 2011-03-31 DIAGNOSIS — M545 Low back pain: Secondary | ICD-10-CM

## 2011-03-31 NOTE — Telephone Encounter (Signed)
Advise patient, will call #30 , 0 RF  tablets today. No further refills without office visit here or orthopedic evaluation, he has been referred twice

## 2011-03-31 NOTE — Telephone Encounter (Signed)
Last ov 01/29/11 last refill 02/23/11

## 2011-04-02 MED ORDER — HYDROCODONE-ACETAMINOPHEN 7.5-750 MG PO TABS: 1.0000 | ORAL_TABLET | Freq: Four times a day (QID) | ORAL | Status: DC | PRN

## 2011-04-02 NOTE — Telephone Encounter (Signed)
Done

## 2011-04-22 ENCOUNTER — Other Ambulatory Visit: Payer: Self-pay | Admitting: Internal Medicine

## 2011-04-22 NOTE — Telephone Encounter (Signed)
No further refills without office visit here or orthopedic evaluation, he has been referred twice

## 2011-04-22 NOTE — Telephone Encounter (Signed)
Last OV 01/29/11. Last filled 04/02/11

## 2011-04-23 NOTE — Telephone Encounter (Signed)
Pt aware of denial for Vicodin. Appointment scheduled to see Dr. Drue Novel

## 2011-04-26 ENCOUNTER — Ambulatory Visit (INDEPENDENT_AMBULATORY_CARE_PROVIDER_SITE_OTHER): Payer: BC Managed Care – PPO | Admitting: Internal Medicine

## 2011-04-26 ENCOUNTER — Encounter: Payer: Self-pay | Admitting: Internal Medicine

## 2011-04-26 VITALS — BP 132/80 | HR 93 | Temp 97.8°F | Ht 72.0 in | Wt 254.4 lb

## 2011-04-26 DIAGNOSIS — M549 Dorsalgia, unspecified: Secondary | ICD-10-CM

## 2011-04-26 DIAGNOSIS — M545 Low back pain: Secondary | ICD-10-CM

## 2011-04-26 DIAGNOSIS — Z23 Encounter for immunization: Secondary | ICD-10-CM

## 2011-04-26 DIAGNOSIS — I10 Essential (primary) hypertension: Secondary | ICD-10-CM

## 2011-04-26 DIAGNOSIS — E119 Type 2 diabetes mellitus without complications: Secondary | ICD-10-CM

## 2011-04-26 LAB — HEMOGLOBIN A1C: Hgb A1c MFr Bld: 5.9 % (ref 4.6–6.5)

## 2011-04-26 LAB — BASIC METABOLIC PANEL
GFR: 80.06 mL/min (ref >60.00)
Potassium: 3.5 mEq/L (ref 3.5–5.1)
Sodium: 139 mEq/L (ref 135–145)

## 2011-04-26 MED ORDER — HYDROCODONE-ACETAMINOPHEN 7.5-750 MG PO TABS: 1.0000 | ORAL_TABLET | Freq: Four times a day (QID) | ORAL | Status: DC | PRN

## 2011-04-26 NOTE — Assessment & Plan Note (Signed)
Well controled, labs

## 2011-04-26 NOTE — Progress Notes (Signed)
  Subjective:    Patient ID: Edward Wilkerson, male    DOB: 1950-08-10, 60 y.o.   MRN: [id number]  HPI Ongoing back pain, was seen by ortho, was offered  local steroid injection but so far has declined given a recent outbreak of meningitis which contaminated steroids. Was prescribed oxycodone which he takes regularly without apparent side effects   Past Medical History  Diagnosis Date  . DM type 2 (diabetes mellitus, type 2)     bordeline  . Sleep apnea     could'nt tol CPAP (has a machine)   . Hypertension   . Hyperlipidemia   . Depression   . 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 RBBB (right bundle branch block with left anterior fascicular block) 2009    neg stress ECHO, saw cards   Past Surgical History  Procedure Date  . Inguinal hernia repair 1977  . Appendectomy 1991  . Lumbar laminectomy 2001    Dr.Jenkins, still has mild pain  . Shoulder surgery 07/2008    (Right)    Review of Systems As far as his diabetes, he has not been able to exercise as much as he would like due to back pain,  has gained a couple pounds. Hypertension, good medication compliance, no recent ambulatory BPs      Objective:   Physical Exam  Constitutional: He appears well-developed and well-nourished. No distress.  HENT:  Head: Normocephalic and atraumatic.  Cardiovascular: Normal rate, regular rhythm and normal heart sounds.   No murmur heard. Pulmonary/Chest: Breath sounds normal. No respiratory distress. He has no wheezes. He has no rales.  Musculoskeletal:       Some discomfort noted from back pain  Skin: He is not diaphoretic.       Assessment & Plan:

## 2011-04-26 NOTE — Assessment & Plan Note (Signed)
Ongoing issues, see a/p per ortho

## 2011-04-26 NOTE — Assessment & Plan Note (Signed)
Continue same meds, labs

## 2011-04-27 ENCOUNTER — Telehealth: Payer: Self-pay

## 2011-04-27 NOTE — Telephone Encounter (Signed)
Message copied by Beverely Low on Tue Apr 27, 2011  4:29 PM ------      Message from: Willow Ora E      Created: Tue Apr 27, 2011 10:44 AM       Advise patient, A1c under excellent control at 5.9. Good results

## 2011-04-27 NOTE — Telephone Encounter (Signed)
Pt aware of lab results 

## 2011-05-20 ENCOUNTER — Other Ambulatory Visit: Payer: Self-pay | Admitting: Internal Medicine

## 2011-05-20 DIAGNOSIS — M545 Low back pain: Secondary | ICD-10-CM

## 2011-05-20 NOTE — Telephone Encounter (Signed)
Ok 60 and 1 RF 

## 2011-05-20 NOTE — Telephone Encounter (Signed)
Last OV, last filled 04-26-11  #60

## 2011-05-21 MED ORDER — HYDROCODONE-ACETAMINOPHEN 7.5-750 MG PO TABS: 1.0000 | ORAL_TABLET | Freq: Four times a day (QID) | ORAL | Status: DC | PRN

## 2011-05-21 NOTE — Telephone Encounter (Signed)
Rx sent 

## 2011-05-21 NOTE — Telephone Encounter (Signed)
Addended byDuaine Dredge, Mikiala Fugett L on: 05/21/2011 10:14 AM   Modules accepted: Orders

## 2011-05-24 ENCOUNTER — Ambulatory Visit (INDEPENDENT_AMBULATORY_CARE_PROVIDER_SITE_OTHER): Payer: BC Managed Care – PPO | Admitting: Internal Medicine

## 2011-05-24 ENCOUNTER — Telehealth: Payer: Self-pay | Admitting: Internal Medicine

## 2011-05-24 VITALS — BP 118/76 | HR 93 | Temp 98.0°F | Ht 71.0 in | Wt 251.0 lb

## 2011-05-24 DIAGNOSIS — M549 Dorsalgia, unspecified: Secondary | ICD-10-CM

## 2011-05-24 MED ORDER — METHOCARBAMOL 500 MG PO TABS: 1000.0000 mg | ORAL_TABLET | Freq: Four times a day (QID) | ORAL | Status: AC

## 2011-05-24 NOTE — Telephone Encounter (Signed)
Patient called pharmacy - they didn't receive request for refill vycodin -[facility] [address] - [place]

## 2011-05-24 NOTE — Progress Notes (Signed)
  Subjective:    Patient ID: Benita Stabile, male    DOB: [birth year], 60 y.o.   MRN: [id number]  HPI Patient has chronic back pain, symptoms exacerbated 2 days ago after he slept  in a couch. pain is now pretty intense   Past Medical History  Diagnosis Date  . DM type 2 (diabetes mellitus, type 2)     bordeline  . Sleep apnea     could'nt tol CPAP (has a machine)   . Hypertension   . Hyperlipidemia   . Depression   . 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 RBBB (right bundle branch block with left anterior fascicular block) 2009    neg stress ECHO, saw cards    Review of Systems No fever or chills No bladder or bowel incontinence Features of the pain are similar to previous episodes Reports poor tolerance to Flexeril do to dry mouth    Objective:   Physical Exam  Constitutional: He is oriented to person, place, and time. He appears well-developed.       Moderate to severe antalgic posture and gait  Musculoskeletal: He exhibits no edema.       Gait is antalgic, strength in the lower extremities is symmetric, back is nontender to palpation, no rash  Neurological: He is alert and oriented to person, place, and time.  Skin: He is not diaphoretic.       Assessment & Plan:

## 2011-05-24 NOTE — Patient Instructions (Signed)
Rest, warm compress, meloxicam as usual, either hydrocodone or oxycontin for pain Try the new muscle relaxant ,methocarbamol Call your ortho doctor

## 2011-05-24 NOTE — Assessment & Plan Note (Signed)
Acute exacerbation of chronic back pain, no red flag symptoms. Unfortunately, there is no much I can do for him today, he is already taking anti-inflammatories regularly and has Vicodin (last time he mentioned oxycodone but today he states he is taking Vicodin) Will switch from Flexeril to Robaxin as Flexeril is causing dry mouth, hopefully the new muscle relaxant will provide some relief. He needs to see his orthopedic doctor

## 2011-05-25 NOTE — Telephone Encounter (Signed)
Rx called in to pharmacy on yesterday.

## 2011-07-05 ENCOUNTER — Ambulatory Visit: Payer: BC Managed Care – PPO | Admitting: Internal Medicine

## 2011-07-13 ENCOUNTER — Other Ambulatory Visit: Payer: Self-pay | Admitting: Internal Medicine

## 2011-07-13 DIAGNOSIS — M545 Low back pain: Secondary | ICD-10-CM

## 2011-07-13 MED ORDER — HYDROCODONE-ACETAMINOPHEN 7.5-750 MG PO TABS: 1.0000 | ORAL_TABLET | Freq: Four times a day (QID) | ORAL | Status: DC | PRN

## 2011-07-13 NOTE — Telephone Encounter (Signed)
Refill request Hydrocodone #60 with zero refills. Last refilled on 12.14.12. OK to refill?

## 2011-07-13 NOTE — Telephone Encounter (Signed)
Refill done.  

## 2011-07-13 NOTE — Telephone Encounter (Signed)
60, no RF 

## 2011-08-11 ENCOUNTER — Telehealth: Payer: Self-pay | Admitting: Internal Medicine

## 2011-08-11 DIAGNOSIS — M545 Low back pain: Secondary | ICD-10-CM

## 2011-08-11 MED ORDER — HYDROCODONE-ACETAMINOPHEN 7.5-750 MG PO TABS: 1.0000 | ORAL_TABLET | Freq: Four times a day (QID) | ORAL | Status: DC | PRN

## 2011-08-11 NOTE — Telephone Encounter (Signed)
Hydrocodone-Acetaminophen (Tab) VICODIN ES 7.5-750 MG Take 1 tablet by mouth every 6 (six) hours as needed.

## 2011-08-11 NOTE — Telephone Encounter (Signed)
Refill done.  

## 2011-08-11 NOTE — Telephone Encounter (Signed)
Refill request VICODIN ES 7.5-750mg . # 60 with zero refills. Last refilled on 2.5.13. OK to refill?

## 2011-08-11 NOTE — Telephone Encounter (Signed)
Ok #60, no RF Needs to see orthopedics if no better

## 2011-08-24 ENCOUNTER — Encounter: Payer: Self-pay | Admitting: Internal Medicine

## 2011-08-24 ENCOUNTER — Ambulatory Visit (INDEPENDENT_AMBULATORY_CARE_PROVIDER_SITE_OTHER): Payer: BC Managed Care – PPO | Admitting: Internal Medicine

## 2011-08-24 DIAGNOSIS — R7309 Other abnormal glucose: Secondary | ICD-10-CM

## 2011-08-24 DIAGNOSIS — R7303 Prediabetes: Secondary | ICD-10-CM

## 2011-08-24 DIAGNOSIS — Z0000 Encounter for general adult medical examination without abnormal findings: Secondary | ICD-10-CM | POA: Insufficient documentation

## 2011-08-24 DIAGNOSIS — G4730 Sleep apnea, unspecified: Secondary | ICD-10-CM

## 2011-08-24 LAB — CBC WITH DIFFERENTIAL/PLATELET
Basophils Absolute: 0 10*3/uL (ref 0.0–0.1)
Eosinophils Relative: 2.4 % (ref 0.0–5.0)
HCT: 46 % (ref 39.0–52.0)
Hemoglobin: 15.8 g/dL (ref 13.0–17.0)
Lymphocytes Relative: 28.9 % (ref 12.0–46.0)
Lymphs Abs: 1.5 10*3/uL (ref 0.7–4.0)
Monocytes Relative: 10.2 % (ref 3.0–12.0)
Platelets: 104 10*3/uL — ABNORMAL LOW (ref 150.0–400.0)
WBC: 5.4 10*3/uL (ref 4.5–10.5)

## 2011-08-24 LAB — COMPREHENSIVE METABOLIC PANEL
ALT: 54 U/L — ABNORMAL HIGH (ref 0–53)
AST: 42 U/L — ABNORMAL HIGH (ref 0–37)
Albumin: 4.5 g/dL (ref 3.5–5.2)
Alkaline Phosphatase: 56 U/L (ref 39–117)
Calcium: 9.4 mg/dL (ref 8.4–10.5)
Chloride: 108 mEq/L (ref 96–112)
Potassium: 4 mEq/L (ref 3.5–5.1)

## 2011-08-24 LAB — HEMOGLOBIN A1C: Hgb A1c MFr Bld: 6.1 % (ref 4.6–6.5)

## 2011-08-24 LAB — LIPID PANEL
HDL: 54.2 mg/dL (ref >39.00)
LDL Cholesterol: 82 mg/dL (ref 0–99)
Total CHOL/HDL Ratio: 3

## 2011-08-24 NOTE — Progress Notes (Signed)
  Subjective:    Patient ID: Edward Wilkerson, male    DOB: [birth year], 61 y.o.   MRN: [id number]  HPI CPX  Past Medical History: Diabetes, borderline Hypertension Hyperlipidemia Depression Sleep apnea,--couldn't tol CPAP (has a machine) 2003-- was Dx w/  splenomegaly and mild ITP RBBB Dx 2009, neg stress ECHO, saw cards Occ LBP even after surgery in the back 2001  Past Surgical History: Inguinal herniorrhaphy (1977) Appendectomy (1991) Lumbar laminectomy (2001) Dr Lovell Sheehan, still has mild pain R shoulder surgery 07-2008  Family History: CAD - F deceased 54 (MI), bro CHF HTN - M DM - M, sister stroke - sister at age 37 (deceased) cirrhosis - sister (no ETOH) colon Ca - no prostate Ca - no breast Ca - sister  Social History: Married, 2 children occupation-- Production designer, theatre/television/film for a Education officer, environmental  Never Smoked ETOH --socially  Drug use-no Regular exercise--not lately  diet-- healthy on-off, weight also fluctuate  Review of Systems No chest pain or shortness of breath No nausea, vomiting, diarrhea blood in the stools. No abdominal pain. No dysuria or gross hematuria. Medications reviewed, good compliance with cholesterol and BP meds.    Objective:   Physical Exam  General:  alert, well-developed, and overweight-appearing.   Neck:  Normal carotid pulses, no thyromegaly.   Lungs:  normal respiratory effort, no intercostal retractions, no accessory muscle use, and normal breath sounds.   Heart:  normal rate, regular rhythm, and no murmur.   Abdomen:  soft, non-tender, no distention, no masses, no guarding, and no rigidity.   Extremities:  no lower extremity edema Neurologic:  alert & oriented X3, strength normal in all extremities, and gait normal.   Psych:  Oriented X3, memory intact for recent and remote, normally interactive, good eye contact, and not depressed appearing.   slightly anxious     Assessment & Plan:

## 2011-08-24 NOTE — Assessment & Plan Note (Addendum)
Td 2005 3th Cscope  per pt was 2010, next in 5 years   (Dr Josue Hector @ [facility]) Prostate cancer screening, every 2 years. PSA in 2012 was normal. labs  EKG, RBBB (old, at baseline) counseled about diet-exercise   Chronic medical problems seem well controlled---> labs  Past history of sleep apnea, unable to tolerate CPAP, I encouraged him to revisit the issue. Offered a referral to pulmonary, as cardiovascular risk d/t sleep apnea discussed---------------------------------referral done

## 2011-08-24 NOTE — Assessment & Plan Note (Signed)
See " general medical examination"

## 2011-08-27 ENCOUNTER — Encounter: Payer: Self-pay | Admitting: *Deleted

## 2011-09-07 ENCOUNTER — Other Ambulatory Visit: Payer: Self-pay | Admitting: Internal Medicine

## 2011-09-07 NOTE — Telephone Encounter (Signed)
Refill For Hydroco/APA 7.5-750MG  Tab Last written 2.5.13 for 60 Instructions: Take 1 tablet by mouth every 6 (six) hours as needed. - Oral

## 2011-09-07 NOTE — Telephone Encounter (Signed)
Ok to refill 

## 2011-09-07 NOTE — Telephone Encounter (Signed)
Needs to discuss with his orthopedic doctor

## 2011-09-08 NOTE — Telephone Encounter (Signed)
Discussed with pt

## 2011-09-08 NOTE — Telephone Encounter (Signed)
LMOVM for pt to return call 

## 2011-09-21 ENCOUNTER — Ambulatory Visit (INDEPENDENT_AMBULATORY_CARE_PROVIDER_SITE_OTHER): Payer: BC Managed Care – PPO | Admitting: Pulmonary Disease

## 2011-09-21 ENCOUNTER — Encounter: Payer: Self-pay | Admitting: Pulmonary Disease

## 2011-09-21 VITALS — BP 110/80 | HR 79 | Temp 98.2°F | Ht 71.5 in | Wt 252.0 lb

## 2011-09-21 DIAGNOSIS — G4730 Sleep apnea, unspecified: Secondary | ICD-10-CM

## 2011-09-21 NOTE — Patient Instructions (Signed)
We will set you up with a CPAP machine with a mask of choice after performing a sleep study

## 2011-09-21 NOTE — Progress Notes (Signed)
  Subjective:    Patient ID: Edward Wilkerson, male    DOB: August 02, 1950, 61 y.o.   MRN: [id number]  HPI Referred by Dr. Drue Novel for re-evaluation for sleep apnea. 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 However he could not use his for long due to intolerance , he felt that the mask was confining although he denies claustrophobia Epworth sleepiness score is a 8/24 .Patient states sleeping  about 9 hours a night on average, and states snores.  Bedtime is 9 to 10 PM, sleep latency is 15-30 minutes, he sleeps on his side with 2 pillows, has one awakening occasional nocturia, is out of bed at 7 AM without morning headaches or dryness of mouth. He drinks 1-2 sodas per day. Is no history of recent weight gain There is no history suggestive of cataplexy, sleep paralysis or parasomnias    Review of Systems  Constitutional: Negative for fever and unexpected weight change.  HENT: Negative for ear pain, nosebleeds, congestion, sore throat, rhinorrhea, sneezing, trouble swallowing, dental problem, postnasal drip and sinus pressure.   Eyes: Negative for redness and itching.  Respiratory: Negative for cough, chest tightness, shortness of breath and wheezing.   Cardiovascular: Negative for palpitations and leg swelling.  Gastrointestinal: Negative for nausea and vomiting.  Genitourinary: Negative for dysuria.  Musculoskeletal: Negative for joint swelling.  Skin: Negative for rash.  Neurological: Negative for headaches.  Hematological: Does not bruise/bleed easily.  Psychiatric/Behavioral: Negative for dysphoric mood. The patient is not nervous/anxious.        Objective:   Physical Exam  Gen. Pleasant, obese, in no distress, normal affect ENT - no lesions, no post nasal drip, class 2 airway Neck: No JVD, no thyromegaly, no carotid bruits Lungs: no use of accessory muscles, no  dullness to percussion, clear without rales or rhonchi  Cardiovascular: Rhythm regular, heart sounds  normal, no murmurs or gallops, no peripheral edema Abdomen: soft and non-tender, no hepatosplenomegaly, BS normal. Musculoskeletal: No deformities, no cyanosis or clubbing Neuro:  alert, non focal       Assessment & Plan:

## 2011-09-22 NOTE — Assessment & Plan Note (Signed)
Given excessive daytime somnolence, narrow pharyngeal exam, witnessed apneas & loud snoring, obstructive sleep apnea is very likely & an overnight polysomnogram will be scheduled as a split study. The pathophysiology of obstructive sleep apnea , it's cardiovascular consequences & modes of treatment including CPAP were discused with the patient in detail & they evidenced understanding.  

## 2011-10-11 ENCOUNTER — Ambulatory Visit (HOSPITAL_BASED_OUTPATIENT_CLINIC_OR_DEPARTMENT_OTHER): Payer: BC Managed Care – PPO | Attending: Pulmonary Disease | Admitting: General Practice

## 2011-10-11 VITALS — Ht 72.0 in | Wt 252.0 lb

## 2011-10-11 DIAGNOSIS — E785 Hyperlipidemia, unspecified: Secondary | ICD-10-CM | POA: Insufficient documentation

## 2011-10-11 DIAGNOSIS — I10 Essential (primary) hypertension: Secondary | ICD-10-CM | POA: Insufficient documentation

## 2011-10-11 DIAGNOSIS — G4733 Obstructive sleep apnea (adult) (pediatric): Secondary | ICD-10-CM

## 2011-10-11 DIAGNOSIS — G4730 Sleep apnea, unspecified: Secondary | ICD-10-CM

## 2011-11-03 DIAGNOSIS — G4733 Obstructive sleep apnea (adult) (pediatric): Secondary | ICD-10-CM

## 2011-11-03 DIAGNOSIS — E785 Hyperlipidemia, unspecified: Secondary | ICD-10-CM

## 2011-11-03 DIAGNOSIS — I10 Essential (primary) hypertension: Secondary | ICD-10-CM

## 2011-11-03 NOTE — Procedures (Signed)
NAMEEARLEY, GROBE NO.:  [id number]  MEDICAL RECORD NO.:  [id number]          PATIENT TYPE:  OUT  LOCATION:  SLEEP CENTER                 FACILITY:  [facility]  PHYSICIAN:  Oretha Milch, MD      DATE OF BIRTH:  July 27, 1950  DATE OF STUDY:  10/11/2011                           NOCTURNAL POLYSOMNOGRAM  REFERRING PHYSICIAN:  Oretha Milch, MD  INDICATION FOR STUDY:  Loud snoring and witnessed apneas in this 61 year old obese gentleman with hypertension and hyperlipidemia.  At the time of this study, he weighed 252 pounds with a height of 6 feet, BMI of 34, neck size of 18.5 inches.  EPWORTH SLEEPINESS SCORE:  6.  This nocturnal polysomnogram was performed with a sleep technologist in attendance.  EEG, EOG, EMG, EKG, and respiratory parameters were recorded.  Sleep stages, arousals, limb movements, and respiratory data were scored according to criteria laid out by the American Academy of Sleep Medicine.  MEDICATIONS:  SLEEP ARCHITECTURE:  Lights out was at 10:18 p.m., lights on was at 5:26 a.m.  Total sleep time was 392 minutes with a sleep period time of 424 minutes and a sleep efficiency of 92%.  Sleep latency was 3.5 minutes, latency to REM sleep was 156 minutes.  Sleep stages of the percentage of total sleep time was N1 19%, N2 59%, N3 9%, REM sleep 13% (50 minutes). Supine sleep accounted for 164 minutes and supine REM sleep for 38 minutes.  REM sleep was noted in three stages with the longest being around 5 a.m.  AROUSAL DATA:  There were total of 119 arousals with an arousal index of 18 events per hour.  Of these, 99 were spontaneous and the rest were associated with respiratory events.  RESPIRATORY DATA:  There were total of 3 obstructive apneas, 2 central apneas, 0 mixed apnea, and 79 hypopneas with an apnea-hypopnea index of 13 events per hour.  There were 27 RERAs with an RDI of 17 events per hour.  Longest apnea was 22 seconds and the longest  hypopnea was 42 seconds.  The REM-related AHI was 43 events per hour and the supine AHI was 18 events per hour.  OXYGEN DATA:  The desaturation index was 19 events per hour.  The lowest desaturation of 82%.  He spent 1.8 minutes with a saturation less than 88%.  CARDIAC DATA:  The low heart rate was 30 beats per minute.  The high heart rate was an artifact.  No arrhythmias were noted.  MOVEMENT-PARASOMNIA:  Limb movement index was 4.3 events per hour.  The limb movement arousal index was only 0.2 events per hour.  DISCUSSION:  He did not have sufficient respiratory events to perform a split night study.  IMPRESSION: 1. Moderate obstructive sleep apnea with hypopneas causing sleep     fragmentation and oxygen desaturation.  This was especially noted     during REM and supine sleep. 2. No evidence of cardiac arrhythmias, limb movements, or behavioral     disturbance during sleep.  RECOMMENDATION: 1. The treatment options at this degree of sleep-disordered breathing     include weight loss and CPAP or oral appliance therapy. 2. He  should be cautioned against driving when sleepy.  He should be     asked to avoid medications with sedative side effects.     Oretha Milch, MD    RVA/MEDQ  D:  11/03/2011 12:01:32  T:  11/03/2011 12:45:14  Job:  [id number]

## 2011-11-04 ENCOUNTER — Telehealth: Payer: Self-pay | Admitting: Pulmonary Disease

## 2011-11-04 DIAGNOSIS — G4733 Obstructive sleep apnea (adult) (pediatric): Secondary | ICD-10-CM

## 2011-11-04 NOTE — Telephone Encounter (Signed)
psg showed moderate obstructive sleep apnea - he stopped breathing 17 times/h. If willing to try, send rx to dme for autocpap 5-12, mask of choice, humidity, download in 2 weeks

## 2011-11-05 NOTE — Telephone Encounter (Signed)
Pt agree's to be set up on cpap--referral has been sent

## 2011-11-26 ENCOUNTER — Ambulatory Visit (INDEPENDENT_AMBULATORY_CARE_PROVIDER_SITE_OTHER): Payer: BC Managed Care – PPO | Admitting: Family Medicine

## 2011-11-26 ENCOUNTER — Encounter: Payer: Self-pay | Admitting: Family Medicine

## 2011-11-26 ENCOUNTER — Telehealth: Payer: Self-pay | Admitting: Family Medicine

## 2011-11-26 VITALS — BP 128/83 | HR 91 | Temp 98.2°F | Ht 71.5 in | Wt 248.6 lb

## 2011-11-26 DIAGNOSIS — K649 Unspecified hemorrhoids: Secondary | ICD-10-CM

## 2011-11-26 DIAGNOSIS — K5901 Slow transit constipation: Secondary | ICD-10-CM

## 2011-11-26 DIAGNOSIS — K921 Melena: Secondary | ICD-10-CM

## 2011-11-26 LAB — HEMOCCULT GUIAC POC 1CARD (OFFICE)

## 2011-11-26 MED ORDER — DIBUCAINE 1 % EX OINT: TOPICAL_OINTMENT | Freq: Three times a day (TID) | CUTANEOUS | Status: AC | PRN

## 2011-11-26 MED ORDER — HYDROCORTISONE ACE-PRAMOXINE 2.5-1 % RE CREA: TOPICAL_CREAM | Freq: Three times a day (TID) | RECTAL | Status: AC

## 2011-11-26 NOTE — Patient Instructions (Addendum)
Call your GI doctor and let him know that you've had constipation and blood in your stool Start stool softeners daily Increase your water intake Try and increase your fiber intake Use Miralax as needed Start the Analpram to heal the hemorrhoid, the nupercainal to numb the pain Call with any questions or concerns Hang in there!!!

## 2011-11-26 NOTE — Progress Notes (Signed)
  Subjective:    Patient ID: Edward Wilkerson, male    DOB: [birth year], 61 y.o.   MRN: [id number]  HPI Constipation- sxs started Monday, 'i was really struggling'.  Took wife's left over colonoscopy prep last night and had large BM this AM.  Had some blood in bowl and large amount on toilet tissue but 'that disappeared after 2 wipes'.  Had small amount of blood in stool on Monday after straining for BM.  No new meds, no change in diet.  No hx of hemorrhoids.  UTD on colonoscopy (2010- due 2015)   Review of Systems For ROS see HPI     Objective:   Physical Exam  Vitals reviewed. Constitutional: He appears well-developed and well-nourished. No distress.  Genitourinary: Rectal exam shows internal hemorrhoid (TTP at 12 o'clock w/ pt leaning over table) and tenderness. Rectal exam shows no fissure and anal tone normal. Guaiac positive stool.          Assessment & Plan:

## 2011-12-14 NOTE — Assessment & Plan Note (Signed)
New.  Likely due to recent bout of constipation.  Increase fluids, fiber.  Add stool softeners, miralax.  Start analpram and nupercainal.  Pt to notify GI doctor.

## 2011-12-14 NOTE — Assessment & Plan Note (Signed)
New.  Likely due to hemorrhoid but advised pt to notify GI doctor of this.  Pt in agreement.

## 2011-12-14 NOTE — Assessment & Plan Note (Signed)
New.  Increase water, fiber.  Add stool softeners, miralax.  Reviewed supportive care and red flags that should prompt return.  Pt expressed understanding and is in agreement w/ plan.

## 2012-01-06 ENCOUNTER — Telehealth: Payer: Self-pay | Admitting: Pulmonary Disease

## 2012-01-06 NOTE — Telephone Encounter (Signed)
Alida, have you seen the CMN on this pt? Please advise, thanks!

## 2012-01-07 ENCOUNTER — Telehealth: Payer: Self-pay | Admitting: Pulmonary Disease

## 2012-01-07 NOTE — Telephone Encounter (Signed)
Mr Ferrin cmn is in Dr Vassie Loll blue folder to be signed .[name]

## 2012-01-07 NOTE — Telephone Encounter (Signed)
LMTCB

## 2012-01-07 NOTE — Telephone Encounter (Signed)
Please advise, thanks.

## 2012-01-10 NOTE — Telephone Encounter (Signed)
[name], [facility] 01/07/2012 12:43 PM Signed  Edward Wilkerson cmn is in Dr Vassie Loll blue folder to be signed .Edward Wilkerson was advised.Carron Curie, CMA

## 2012-01-13 ENCOUNTER — Telehealth: Payer: Self-pay | Admitting: Pulmonary Disease

## 2012-01-13 NOTE — Telephone Encounter (Signed)
I have faxed CMN over to [facility]. I called Edward Wilkerson and made her aware of this. She stated she won't know until Monday if she received it or not but will call back if not.

## 2012-01-14 ENCOUNTER — Telehealth: Payer: Self-pay | Admitting: Pulmonary Disease

## 2012-01-14 NOTE — Telephone Encounter (Signed)
lmomtcb x1 

## 2012-01-14 NOTE — Telephone Encounter (Signed)
Download 7/13 on auto - avg pr  8 cm, residual AHi 7/h He is not using CPAP much. PL let him know that compliance with goal of at least 4-6 hrs every night is the expectation.  Otherwise cpap will be taken away

## 2012-01-17 NOTE — Telephone Encounter (Signed)
Pt returned call.  Advised of DL results/recs as stated by RA.  Pt verbalized his understanding and stated that he has spoken with a rep at [facility] and was given some tips on his CPAP and compliance and will be impelementing these (pt did not specify the "tips").  Pt stated he will try to be more compliant with his CPAP and will keep his 8.20.13 ov with RA in the [facility] office.  Will sign off.

## 2012-01-25 ENCOUNTER — Ambulatory Visit (INDEPENDENT_AMBULATORY_CARE_PROVIDER_SITE_OTHER): Payer: BC Managed Care – PPO | Admitting: Pulmonary Disease

## 2012-01-25 ENCOUNTER — Encounter: Payer: Self-pay | Admitting: Pulmonary Disease

## 2012-01-25 VITALS — BP 128/74 | HR 98 | Temp 97.8°F | Ht 71.5 in | Wt 246.0 lb

## 2012-01-25 DIAGNOSIS — G4730 Sleep apnea, unspecified: Secondary | ICD-10-CM

## 2012-01-25 NOTE — Assessment & Plan Note (Signed)
Moderate OSA- AHi 17/h Poor tolerance to CPAP - we came up with foll plan to improve compliance  Try using CPAP with full face mask Call me by sep 3 -If this does not work, Options include -change to fixed pr to 8 cm -go back to nasal pillows with chin strap -last option would be dental appliance  Weight loss encouraged, compliance with goal of at least 4-6 hrs every night is the expectation. Advised against medications with sedative side effects Cautioned against driving when sleepy - understanding that sleepiness will vary on a day to day basis

## 2012-01-25 NOTE — Progress Notes (Signed)
  Subjective:    Patient ID: Edward Wilkerson, male    DOB: July 28, 1950, 61 y.o.   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 However he could not use his for long due to intolerance , he felt that the mask was confining although he denies claustrophobia Epworth sleepiness score is a 8/24 .Patient states sleeping about 9 hours a night on average, and states snores.  Bedtime is 9 to 10 PM, sleep latency is 15-30 minutes, he sleeps on his side with 2 pillows, has one awakening occasional nocturia, is out of bed at 7 AM without morning headaches or dryness of mouth. He drinks 1-2 sodas per day. Is no history of recent weight gain psg showed moderate obstructive sleep apnea  - he stopped breathing 17 times/h.  Started on  autocpap 5-12, mask of choice, humidity Download 7/13 on auto - avg pr 8 cm, residual AHi 7/h showed poor compliance He cannot pinpoint why - pressure seems ok, mask was changed from pillows ot full face, pressure points bother him a bit.No dryness, uses humidity   Past Medical History  Diagnosis Date  . DM type 2 (diabetes mellitus, type 2)     bordeline  . Sleep apnea     could'nt tol CPAP (has a machine)   . Hypertension   . Hyperlipidemia   . Depression   . 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 RBBB (right bundle branch block with left anterior fascicular block) 2009    neg stress ECHO, saw cards     Review of Systems neg for any significant sore throat, dysphagia, itching, sneezing, nasal congestion or excess/ purulent secretions, fever, chills, sweats, unintended wt loss, pleuritic or exertional cp, hempoptysis, orthopnea pnd or change in chronic leg swelling. Also denies presyncope, palpitations, heartburn, abdominal pain, nausea, vomiting, diarrhea or change in bowel or urinary habits, dysuria,hematuria, rash, arthralgias,  visual complaints, headache, numbness weakness or ataxia.     Objective:   Physical Exam  Gen. Pleasant, obese, in no distress ENT - no lesions, no post nasal drip Neck: No JVD, no thyromegaly, no carotid bruits Lungs: no use of accessory muscles, no dullness to percussion, decreased without rales or rhonchi  Cardiovascular: Rhythm regular, heart sounds  normal, no murmurs or gallops, no peripheral edema Musculoskeletal: No deformities, no cyanosis or clubbing , no tremors       Assessment & Plan:

## 2012-01-25 NOTE — Patient Instructions (Addendum)
Try using CPAP with full face mask Call me by sep 3 -If this does not work, Options include -change to fixed pr to 8 cm -go back to nasal pillows with chin strap -last option would be dental appliance

## 2012-02-15 ENCOUNTER — Telehealth: Payer: Self-pay | Admitting: Pulmonary Disease

## 2012-02-15 NOTE — Telephone Encounter (Signed)
Per RA OV note 01/25/12: Patient Instructions     Try using CPAP with full face mask  Call me by sep 3 -If this does not work, Options include  -change to fixed pr to 8 cm  -go back to nasal pillows with chin strap  -last option would be dental appliance     [email or web address] x1

## 2012-02-16 NOTE — Telephone Encounter (Signed)
LMTCB

## 2012-02-17 NOTE — Telephone Encounter (Signed)
Spoke with pt. He states that he does not want to use CPAP at all any longer. Would not explain why- just states "everything is wrong with it and Dr. Vassie Loll already knows about it. I asked if he wanted to try dental appliance and he states does not want that either for now "I have a lot going on".  Pt states wants no tx for now and will call back and let RA know once he thinks more about dental appliance. Will forward to RA so that he is aware.

## 2012-02-17 NOTE — Telephone Encounter (Signed)
lmomtcb  

## 2012-02-18 ENCOUNTER — Other Ambulatory Visit: Payer: Self-pay | Admitting: Internal Medicine

## 2012-02-18 MED ORDER — ATORVASTATIN CALCIUM 20 MG PO TABS: 20.0000 mg | ORAL_TABLET | Freq: Every day | ORAL | Status: DC

## 2012-02-18 MED ORDER — OLMESARTAN MEDOXOMIL-HCTZ 40-25 MG PO TABS: 1.0000 | ORAL_TABLET | Freq: Every day | ORAL | Status: DC

## 2012-02-18 NOTE — Telephone Encounter (Signed)
Discussed with pt, sent in rx.  

## 2012-02-18 NOTE — Telephone Encounter (Signed)
Pt came in and needs a few pills to get him through until next Thursday's appt. Please call him to let him know what you can do for him.

## 2012-02-23 ENCOUNTER — Telehealth: Payer: Self-pay | Admitting: Pulmonary Disease

## 2012-02-23 DIAGNOSIS — G4730 Sleep apnea, unspecified: Secondary | ICD-10-CM

## 2012-02-23 NOTE — Telephone Encounter (Signed)
Ok to send order to [facility] cp[facility]

## 2012-02-23 NOTE — Telephone Encounter (Signed)
Pt called on 02-15-12 and stated the following: Edward Wilkerson, [facility] 02/17/2012 1:23 PM Signed  Spoke with pt. He states that he does not want to use CPAP at all any longer. Would not explain why- just states "everything is wrong with it and Dr. Vassie Loll already knows about it. I asked if he wanted to try dental appliance and he states does not want that either for now "I have a lot going on". Pt states wants no tx for now and will call back and let RA know once he thinks more about dental appliance. Will forward to RA so that he is aware.   Pt is wanting an order sent to [facility] to d/c cpap. Please advise. Carron Curie, CMA

## 2012-02-23 NOTE — Telephone Encounter (Signed)
Order has been placed and pt aware. Nothing further was needed 

## 2012-02-24 ENCOUNTER — Ambulatory Visit (INDEPENDENT_AMBULATORY_CARE_PROVIDER_SITE_OTHER): Payer: BC Managed Care – PPO | Admitting: Internal Medicine

## 2012-02-24 ENCOUNTER — Encounter: Payer: Self-pay | Admitting: Internal Medicine

## 2012-02-24 VITALS — BP 124/82 | HR 82 | Temp 98.1°F | Wt 249.0 lb

## 2012-02-24 DIAGNOSIS — K649 Unspecified hemorrhoids: Secondary | ICD-10-CM

## 2012-02-24 DIAGNOSIS — M549 Dorsalgia, unspecified: Secondary | ICD-10-CM

## 2012-02-24 DIAGNOSIS — I10 Essential (primary) hypertension: Secondary | ICD-10-CM

## 2012-02-24 DIAGNOSIS — E119 Type 2 diabetes mellitus without complications: Secondary | ICD-10-CM

## 2012-02-24 DIAGNOSIS — R945 Abnormal results of liver function studies: Secondary | ICD-10-CM

## 2012-02-24 DIAGNOSIS — E785 Hyperlipidemia, unspecified: Secondary | ICD-10-CM

## 2012-02-24 DIAGNOSIS — G4730 Sleep apnea, unspecified: Secondary | ICD-10-CM

## 2012-02-24 DIAGNOSIS — Z23 Encounter for immunization: Secondary | ICD-10-CM

## 2012-02-24 LAB — ALT: ALT: 57 U/L — ABNORMAL HIGH (ref 0–53)

## 2012-02-24 NOTE — Assessment & Plan Note (Signed)
Well-controlled, last BMP normal 

## 2012-02-24 NOTE — Progress Notes (Signed)
  Subjective:    Patient ID: Edward Wilkerson, male    DOB: 07/15/1950, 61 y.o.   MRN: [id number]  HPI ROV Doing well His medication list is reviewed, good medication compliance We talk about diet and exercise, he is doing some better, see social history. Trying to use a CPAP, struggling with it.  Past Medical History: Diabetes, borderline Hypertension Hyperlipidemia Depression Sleep apnea,--couldn't tol CPAP (has a machine) 2003-- was Dx w/  splenomegaly and mild ITP RBBB Dx 2009, neg stress ECHO, saw cards Occ LBP even after surgery in the back 2001  Past Surgical History: Inguinal herniorrhaphy (1977) Appendectomy (1991) Lumbar laminectomy (2001) Dr Lovell Sheehan, still has mild pain R shoulder surgery 07-2008  Family History: CAD - F deceased 25 (MI), bro CHF HTN - M DM - M, sister stroke - sister at age 74 (deceased) cirrhosis - sister (no ETOH) colon Ca - no prostate Ca - no breast Ca - sister  Social History: Married, 2 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 Was seen with hemorrhoidal bleed, no further bleeding. Has chronic on and off back pain, still having issues, to have physical therapy soon    Objective:   Physical Exam  General -- alert, well-developed, and overweight appearing. No apparent distress.  Psych-- Cognition and judgment appear intact. Alert and cooperative with normal attention span and concentration.  not anxious appearing and not depressed appearing.         Assessment & Plan:  Today , I spent more than 15 min with the patient, >50% of the time counseling

## 2012-02-24 NOTE — Assessment & Plan Note (Addendum)
A1c is stable, we had a conversation about diet and exercise, I recommend a gym membership during the winter.

## 2012-02-24 NOTE — Assessment & Plan Note (Signed)
Working hard trying to tolerate a CPAP

## 2012-02-24 NOTE — Assessment & Plan Note (Signed)
On and off issue, will start physical therapy soon.

## 2012-02-24 NOTE — Assessment & Plan Note (Signed)
Resolved

## 2012-02-24 NOTE — Assessment & Plan Note (Addendum)
Well-controlled per last FLP 

## 2012-02-24 NOTE — Assessment & Plan Note (Signed)
last AST ALT were slightly elevated, he drinks ETOH  only socially and takes Tylenol as needed. Plan: Repeat LFTs

## 2012-02-28 ENCOUNTER — Encounter: Payer: Self-pay | Admitting: *Deleted

## 2012-05-12 ENCOUNTER — Ambulatory Visit (INDEPENDENT_AMBULATORY_CARE_PROVIDER_SITE_OTHER): Payer: BC Managed Care – PPO | Admitting: Internal Medicine

## 2012-05-12 ENCOUNTER — Encounter: Payer: Self-pay | Admitting: Internal Medicine

## 2012-05-12 ENCOUNTER — Ambulatory Visit: Payer: BC Managed Care – PPO | Admitting: Internal Medicine

## 2012-05-12 VITALS — BP 126/78 | HR 97 | Temp 97.6°F | Ht 72.0 in | Wt 252.4 lb

## 2012-05-12 DIAGNOSIS — R05 Cough: Secondary | ICD-10-CM

## 2012-05-12 NOTE — Progress Notes (Signed)
HPI  Pt presents to the clinic today with c/o dry cough x 1 week. It is intermittent. It is not bad during the night. It does not keep him awake. It has not been bad enough for him to even take OTC's for it. He has been using cough drops which have helped. He denies fever, chills, nausea, vomiting or body aches. He has not had any sick contacts.  Review of Systems    Past Medical History  Diagnosis Date  . DM type 2 (diabetes mellitus, type 2)     bordeline  . Sleep apnea     could'nt tol CPAP (has a machine)   . Hypertension   . Hyperlipidemia   . Depression   . 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 RBBB (right bundle branch block with left anterior fascicular block) 2009    neg stress ECHO, saw cards    Family History  Problem Relation Age of Onset  . Coronary artery disease Father   . Coronary artery disease Brother   . Heart attack Father 39    deceased  . Hypertension Mother   . Diabetes Mother   . Diabetes Sister   . Stroke Sister     deceased   . Breast cancer Sister   . Cirrhosis Sister     no ETOH    History   Social History  . Marital Status: Married    Spouse Name: N/A    Number of Children: 2  . Years of Education: N/A   Occupational History  . Astronomer company   Social History Main Topics  . Smoking status: Never Smoker   . Smokeless tobacco: Former Neurosurgeon    Quit date: 09/21/1991  . Alcohol Use: 0.0 oz/week     Comment: beer socially  . Drug Use: No  . Sexually Active: Not on file   Other Topics Concern  . Not on file   Social History Narrative  . No narrative on file    Allergies  Allergen Reactions  . Sulfonamide Derivatives     rash     Constitutional:  Denies fever, fatigue, headache orabrupt weight changes.  HEENT:   Denies eye redness, eye pain, pressure behind the eyes, facial pain, nasal congestion  ear pain, ringing in the ears, wax buildup, runny nose or bloody nose. Respiratory: Positive cough. Denies difficulty  breathing or shortness of breath or sputum production.  Cardiovascular:. Denies chest pain, chest tightness, palpitations or swelling in the hands or feet.   No other specific complaints in a complete review of systems (except as listed in HPI above).  Objective:    General: Appears his stated age, well developed, well nourished in NAD. HEENT: Head: normal shape and size; Eyes: sclera white, no icterus, conjunctiva pink, PERRLA and EOMs intact; Ears: Tm's gray and intact, normal light reflex; Nose: mucosa pink and moist, septum midline; Throat/Mouth:  Teeth present, mucosa pink and moist, no exudate noted, no lesions or ulcerations noted.  Neck: No lymphadenopathy. Neck supple, trachea midline. No massses, lumps or thyromegaly present.  Cardiovascular: Normal rate and rhythm. S1,S2 noted.  No murmur, rubs or gallops noted. No JVD or BLE edema. No carotid bruits noted. Pulmonary/Chest: Normal effort and fine rhonchi noted in bilateral bases. No respiratory distress. No wheezes, rales noted.      Assessment & Plan:   Cough, dry, new onset with additional workup required:  Most likely viral and will resolve within 1  week Continue using cough drops as needed.  RTC as needed or if symptoms persist.

## 2012-05-12 NOTE — Patient Instructions (Signed)

## 2012-07-10 ENCOUNTER — Telehealth: Payer: Self-pay | Admitting: Pulmonary Disease

## 2012-07-10 NOTE — Telephone Encounter (Signed)
Called pt x 3 to make next appt per recall.  Left 3 messages and pt never called back.  Mailed recall letter 07/10/12. Emily E McAlister  °

## 2012-07-28 ENCOUNTER — Ambulatory Visit (INDEPENDENT_AMBULATORY_CARE_PROVIDER_SITE_OTHER): Payer: BC Managed Care – PPO | Admitting: Internal Medicine

## 2012-07-28 VITALS — BP 132/84 | HR 91 | Temp 97.9°F | Wt 253.0 lb

## 2012-07-28 DIAGNOSIS — M25519 Pain in unspecified shoulder: Secondary | ICD-10-CM

## 2012-07-28 DIAGNOSIS — E119 Type 2 diabetes mellitus without complications: Secondary | ICD-10-CM

## 2012-07-28 LAB — GLUCOSE, POCT (MANUAL RESULT ENTRY): POC Glucose: 117 mg/dl — AB (ref 70–99)

## 2012-07-28 MED ORDER — ATORVASTATIN CALCIUM 20 MG PO TABS: 20.0000 mg | ORAL_TABLET | Freq: Every day | ORAL | Status: DC

## 2012-07-28 MED ORDER — PREDNISONE 10 MG PO TABS: ORAL_TABLET | ORAL | Status: DC

## 2012-07-28 MED ORDER — OLMESARTAN MEDOXOMIL-HCTZ 40-25 MG PO TABS: 1.0000 | ORAL_TABLET | Freq: Every day | ORAL | Status: DC

## 2012-07-28 NOTE — Assessment & Plan Note (Addendum)
One-week history of pain in the shoulder area, unclear if this is a primary shoulder problem or a C6 radiculopathy. I favor a C6 radiculopathy as he is able to move the shoulder without problems. Plan: Refer to [place] [facility], low dose of prednisone (patient has borderline diabetes, CBG today 117), recommend to monitor CBGs closely. See instructions. Also she has oxycodone, prescribed for back pain, patient to take it as needed.

## 2012-07-28 NOTE — Patient Instructions (Addendum)
Warm compress For pain, take Tylenol first and then Oxycodone as needed Take prednisone as prescribed, checked your blood sugars while in prednisone, if sugars increase more than 180, stop taking prednisone We are referring you to the orthopedic doctor

## 2012-07-28 NOTE — Progress Notes (Signed)
  Subjective:    Patient ID: Edward Wilkerson, male    DOB: 11-12-1950, 62 y.o.   MRN: [id number]  HPI Acute visit One-week history of right shoulder pain, at the posterior aspectof the shoulder, aches  all the time but worse with certain positions. Decrease when his arms are down. No neck pain per se but the pain extends up to the neck, see graphic.  Past Medical History: Diabetes, borderline Hypertension Hyperlipidemia Depression Sleep apnea,--couldn't tol CPAP (has a machine) 2003-- was Dx w/  splenomegaly and mild ITP RBBB Dx 2009, neg stress ECHO, saw cards Occ LBP even after surgery in the back 2001  Past Surgical History: Inguinal herniorrhaphy (1977) Appendectomy (1991) Lumbar laminectomy (2001) Dr Lovell Sheehan, still has mild pain R shoulder surgery 07-2008  Family History: CAD - F deceased 5 (MI), bro CHF HTN - M DM - M, sister stroke - sister at age 16 (deceased) cirrhosis - sister (no ETOH) colon Ca - no prostate Ca - no breast Ca - sister  Social History: Married, 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 Denies any tingling in the upper or lower extremities. He has OxyContin at home prescribed for back pain. No CP, SOB, no post prandial shoulder pain     Objective:   Physical Exam  Musculoskeletal:       Back:       Arms:  General -- alert, well-developed  Neck --ROM full, mild bilateral neck pain at extremes of his ROM; no tenderness to palpation of the cervical spine Lungs -- normal respiratory effort, no intercostal retractions, no accessory muscle use, and normal breath sounds.   Heart-- normal rate, regular rhythm, no murmur, and no gallop.   Extremities-- Shoulders without deformities, range of motion is normal  bilaterally without apparent increase or decrease of the pain. He does have a well-healed surgical scar at the right posterior shoulder from a cyst excision. Neurologic-- alert & oriented X3 , DTRs---> generalize decrease but symmetric ;strength normal in all extremities. Psych-- Cognition and judgment appear intact. Alert and cooperative with normal attention span and concentration.  not anxious appearing and not depressed appearing.       Assessment & Plan:

## 2012-07-29 ENCOUNTER — Encounter: Payer: Self-pay | Admitting: Internal Medicine

## 2012-08-01 ENCOUNTER — Telehealth: Payer: Self-pay | Admitting: *Deleted

## 2012-08-01 MED ORDER — ATORVASTATIN CALCIUM 20 MG PO TABS: 20.0000 mg | ORAL_TABLET | Freq: Every day | ORAL | Status: DC

## 2012-08-01 MED ORDER — OLMESARTAN MEDOXOMIL-HCTZ 40-25 MG PO TABS: 1.0000 | ORAL_TABLET | Freq: Every day | ORAL | Status: DC

## 2012-08-01 NOTE — Telephone Encounter (Signed)
Refill done.  

## 2012-08-16 ENCOUNTER — Other Ambulatory Visit: Payer: Self-pay | Admitting: Specialist

## 2012-08-16 DIAGNOSIS — M25511 Pain in right shoulder: Secondary | ICD-10-CM

## 2012-08-22 ENCOUNTER — Ambulatory Visit
Admission: RE | Admit: 2012-08-22 | Discharge: 2012-08-22 | Disposition: A | Discharge status: Home / Self Care | Payer: BC Managed Care – PPO | Source: Ambulatory Visit | Attending: Specialist | Admitting: Specialist

## 2012-08-22 DIAGNOSIS — M25511 Pain in right shoulder: Secondary | ICD-10-CM

## 2012-08-22 MED ORDER — IOHEXOL 180 MG/ML  SOLN
15.0000 mL | Freq: Once | INTRAMUSCULAR | Status: AC | PRN
  Administered 2012-08-22: 13 mL via INTRA_ARTICULAR

## 2012-08-25 ENCOUNTER — Encounter: Payer: BC Managed Care – PPO | Admitting: Internal Medicine

## 2012-10-11 ENCOUNTER — Ambulatory Visit (INDEPENDENT_AMBULATORY_CARE_PROVIDER_SITE_OTHER): Payer: BC Managed Care – PPO | Admitting: Internal Medicine

## 2012-10-11 ENCOUNTER — Encounter: Payer: Self-pay | Admitting: Internal Medicine

## 2012-10-11 VITALS — BP 124/82 | HR 81 | Temp 98.2°F | Ht 72.0 in | Wt 248.0 lb

## 2012-10-11 DIAGNOSIS — Z0000 Encounter for general adult medical examination without abnormal findings: Secondary | ICD-10-CM

## 2012-10-11 DIAGNOSIS — I10 Essential (primary) hypertension: Secondary | ICD-10-CM

## 2012-10-11 DIAGNOSIS — R42 Dizziness and giddiness: Secondary | ICD-10-CM

## 2012-10-11 DIAGNOSIS — E119 Type 2 diabetes mellitus without complications: Secondary | ICD-10-CM

## 2012-10-11 DIAGNOSIS — E785 Hyperlipidemia, unspecified: Secondary | ICD-10-CM

## 2012-10-11 LAB — LIPID PANEL
Cholesterol: 169 mg/dL (ref 0–200)
HDL: 55.1 mg/dL (ref >39.00)
LDL Cholesterol: 94 mg/dL (ref 0–99)
Triglycerides: 98 mg/dL (ref 0.0–149.0)

## 2012-10-11 LAB — COMPREHENSIVE METABOLIC PANEL
Albumin: 4.3 g/dL (ref 3.5–5.2)
Alkaline Phosphatase: 61 U/L (ref 39–117)
BUN: 13 mg/dL (ref 6–23)
CO2: 26 mEq/L (ref 19–32)
Calcium: 9.6 mg/dL (ref 8.4–10.5)
Chloride: 104 mEq/L (ref 96–112)
Glucose, Bld: 125 mg/dL — ABNORMAL HIGH (ref 70–99)
Potassium: 4 mEq/L (ref 3.5–5.1)
Sodium: 136 mEq/L (ref 135–145)
Total Protein: 6.6 g/dL (ref 6.0–8.3)

## 2012-10-11 LAB — CBC WITH DIFFERENTIAL/PLATELET
Eosinophils Absolute: 0.1 10*3/uL (ref 0.0–0.7)
Eosinophils Relative: 1.1 % (ref 0.0–5.0)
Lymphocytes Relative: 18.9 % (ref 12.0–46.0)
MCV: 92.1 fl (ref 78.0–100.0)
Monocytes Absolute: 0.4 10*3/uL (ref 0.1–1.0)
Neutrophils Relative %: 72.3 % (ref 43.0–77.0)
Platelets: 107 10*3/uL — ABNORMAL LOW (ref 150.0–400.0)
RBC: 5.14 Mil/uL (ref 4.22–5.81)
WBC: 5.5 10*3/uL (ref 4.5–10.5)

## 2012-10-11 LAB — PSA: PSA: 1.26 ng/mL (ref 0.10–4.00)

## 2012-10-11 MED ORDER — MECLIZINE HCL 12.5 MG PO TABS: 12.5000 mg | ORAL_TABLET | Freq: Three times a day (TID) | ORAL | Status: DC | PRN

## 2012-10-11 NOTE — Assessment & Plan Note (Addendum)
See history of present illness, dizziness for the last 2 weeks, likely a peripheral issue --> he did get dizzy during this visit getting up from the examining table. He takes OJ when dizzy thinking he may have low sugar, rec against it unless CBG is documented low  He is  high risk for Carotid disease and strokes. EKG at baseline Plan: Carotid ultrasound, treated with Antivert, if symptoms severe may need to see neurology for further eval.will call if associated sx such as slurred speach, etc

## 2012-10-11 NOTE — Progress Notes (Signed)
  Subjective:    Patient ID: Edward Wilkerson, male    DOB: 1950-11-26, 62 y.o.   MRN: [id number]  HPI CPX plus  evaluation of dizziness Report severe dizziness for the last 2 weeks, on-off, typically sx happen  first thing in the morning when he is in bed and turns his head around, last few seconds, not spinning, can describe it well, like a imbalance? Not associated with headache, nausea, visual disturbances, diplopia? ( no blindness). No associated facial numbness or slurred speech. Today he had a particularly severe episode and still feeling uneasy, can't describe it better. Yesterday he had eye exam, eyes were dilated, got good reports.   Past Medical History  Diagnosis Date  . DM type 2 (diabetes mellitus, type 2)     bordeline  . Sleep apnea     could'nt tol CPAP (has a machine)   . Hypertension   . Hyperlipidemia   . Depression   . 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 RBBB (right bundle branch block with left anterior fascicular block) 2009    neg stress ECHO, saw cards  . Back pain     on-off even after surgery   Past Surgical History  Procedure Laterality Date  . Inguinal hernia repair  1977  . Appendectomy  1991  . Lumbar laminectomy  2001    Dr.Jenkins, still has mild pain  . Shoulder surgery  07/2008    (Right)     Family History: CAD - F deceased 88 (MI), bro CHF HTN - M DM - M, sister stroke - sister at age 14 (deceased) cirrhosis - sister (no ETOH) colon Ca - no prostate Ca - no breast Ca - sister   Review of Systems No chest pain, shortness or breath, palpitations. Occasional back pain as usual.  no nausea, vomiting, diarrhea. No difficulty urinating or dysuria.     Objective:   Physical Exam BP 124/82  Pulse 81  Temp(Src) 98.2 F (36.8 C) (Oral)  Ht 6' (1.829 m)  Wt 248 lb (112.492 kg)  BMI 33.63 kg/m2  SpO2 99%  General -- alert, well-developed, No apparent distress.   Neck --no thyromegaly , normal carotid pulse  HEENT -- Not  pale or jaundice Lungs -- normal respiratory effort, no intercostal retractions, no accessory muscle use, and normal breath sounds.   Heart-- normal rate, regular rhythm, no murmur, and no gallop.   Abdomen--soft, non-tender, no distention, no masses, no HSM, no guarding, and no rigidity.   Extremities-- no pretibial edema bilaterally Rectal-- No external abnormalities noted. Normal sphincter tone. No rectal masses or tenderness. Brown stool  Neurologic-- alert & oriented X3 ; Speech gait and motor intact. EOMI-PERLA. Psych-- Cognition and judgment appear intact. Alert and cooperative with normal attention span and concentration.  not anxious appearing and not depressed appearing.       Assessment & Plan:

## 2012-10-11 NOTE — Assessment & Plan Note (Addendum)
Td 2005 3th Cscope  per pt was 2010, next in 5 years   (Dr Josue Hector @ [facility]) Prostate cancer screening, every 2 years, due for a PSA, DRE normal today. Labs (although had orange juice today)  EKG, RBBB (old, at baseline) counseled about diet-exercise

## 2012-10-11 NOTE — Assessment & Plan Note (Signed)
Good medication compliance, BP today normal. No change

## 2012-10-11 NOTE — Assessment & Plan Note (Signed)
Due for labs

## 2012-10-11 NOTE — Patient Instructions (Addendum)
Rest, take fluids, Antivert as needed for dizziness (may cause drowsiness) Call if symptoms increase or not better in 2-3 weeks Next visit in 4 months.

## 2012-10-12 ENCOUNTER — Encounter: Payer: Self-pay | Admitting: Internal Medicine

## 2012-10-13 NOTE — Addendum Note (Signed)
Addended by: Candie Echevaria L on: 10/13/2012 02:22 PM   Modules accepted: Orders

## 2012-10-17 ENCOUNTER — Encounter (INDEPENDENT_AMBULATORY_CARE_PROVIDER_SITE_OTHER): Payer: BC Managed Care – PPO

## 2012-10-17 ENCOUNTER — Encounter: Payer: Self-pay | Admitting: *Deleted

## 2012-10-17 DIAGNOSIS — R42 Dizziness and giddiness: Secondary | ICD-10-CM

## 2012-10-17 DIAGNOSIS — I6529 Occlusion and stenosis of unspecified carotid artery: Secondary | ICD-10-CM

## 2012-10-18 ENCOUNTER — Telehealth: Payer: Self-pay | Admitting: *Deleted

## 2012-10-18 NOTE — Telephone Encounter (Signed)
Pt eft VM that he would like to get result of his Carotid ultrasound.Please advise

## 2012-10-19 NOTE — Telephone Encounter (Signed)
(  carotid ultrasound showed bilateral 0-39%.) Advise patient, he has  mild cholesterol buildup in the carotid arteries, I don't think that accounts for his dizziness.  If he is not better let me know

## 2012-10-19 NOTE — Telephone Encounter (Signed)
Pt states that so far symptoms have improved. Pt advised if symptoms change to give [place] a call back. Pt ok.

## 2012-10-20 ENCOUNTER — Encounter: Payer: Self-pay | Admitting: General Practice

## 2013-01-17 ENCOUNTER — Ambulatory Visit (INDEPENDENT_AMBULATORY_CARE_PROVIDER_SITE_OTHER): Payer: BC Managed Care – PPO | Admitting: Internal Medicine

## 2013-01-17 VITALS — BP 106/70 | HR 84 | Temp 98.1°F | Wt 249.0 lb

## 2013-01-17 DIAGNOSIS — M549 Dorsalgia, unspecified: Secondary | ICD-10-CM

## 2013-01-17 MED ORDER — METHOCARBAMOL 750 MG PO TABS: 750.0000 mg | ORAL_TABLET | Freq: Three times a day (TID) | ORAL | Status: DC | PRN

## 2013-01-17 MED ORDER — MELOXICAM 15 MG PO TABS: 15.0000 mg | ORAL_TABLET | Freq: Every day | ORAL | Status: DC

## 2013-01-17 NOTE — Patient Instructions (Addendum)
Rest, heating pad mobic daily   as needed for pain. Always take it with food because may cause gastritis and ulcers. If you notice nausea, stomach pain, change in the color of stools --->  Stop the medicine and let us know Oxycodone as needed Robaxin 3 times a day, as needed. Will cause drowsiness Call if no better in few days

## 2013-01-17 NOTE — Progress Notes (Signed)
  Subjective:    Patient ID: Edward Wilkerson, male    DOB: March 26, 1951, 62 y.o.   MRN: [id number]  HPI Acute visit Sudden onset low-eft-sided back pain, 4 days ago while washing his hands. No radiation to the buttock or leg, some radiation up . Currently taking a leftover Percocet 3-4 a day with mild relief only. Pain decrease with stretching increases by walking and getting in and out of his car  Past Medical History  Diagnosis Date  . DM type 2 (diabetes mellitus, type 2)     bordeline  . Sleep apnea     could'nt tol CPAP (has a machine)   . Hypertension   . Hyperlipidemia   . Depression   . 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 RBBB (right bundle branch block with left anterior fascicular block) 2009    neg stress ECHO, saw cards  . Back pain     on-off even after surgery   Past Surgical History  Procedure Laterality Date  . Inguinal hernia repair  1977  . Appendectomy  1991  . Lumbar laminectomy  2001    Dr.Jenkins, still has mild pain  . Shoulder surgery  07/2008    (Right)     Review of Systems No fever or chills No  back rash No back  Injury, no other joint aches No bladder or bowel incontinence, no dysuria or hematuria     Objective:   Physical Exam BP 106/70  Pulse 84  Temp(Src) 98.1 F (36.7 C) (Oral)  Wt 249 lb (112.946 kg)  BMI 33.76 kg/m2  SpO2 96% General -- alert, well-developed.   Back--Not tender to palpation, no rash; + + Antalgic posture and gait  Neurologic-- alert & oriented X3 ; DTRs symmetric except for slightly decreased right ankle jerk. Motor symmetric. Straight leg test negative.Marland Kitchen Psych-- Cognition and judgment appear intact. Alert and cooperative with normal attention span and concentration.  not anxious appearing and not depressed appearing.       Assessment & Plan:

## 2013-01-17 NOTE — Assessment & Plan Note (Addendum)
See previous entries, acute exacerbation of back pain. Plan: Mobic, Robaxin, cont oxycodone, still has some at home , call for RF when needed . See instructions

## 2013-01-18 ENCOUNTER — Encounter: Payer: Self-pay | Admitting: Internal Medicine

## 2013-02-12 ENCOUNTER — Ambulatory Visit (INDEPENDENT_AMBULATORY_CARE_PROVIDER_SITE_OTHER): Payer: BC Managed Care – PPO | Admitting: Internal Medicine

## 2013-02-12 ENCOUNTER — Encounter: Payer: Self-pay | Admitting: Internal Medicine

## 2013-02-12 VITALS — BP 128/81 | HR 79 | Temp 97.9°F | Wt 249.6 lb

## 2013-02-12 DIAGNOSIS — M549 Dorsalgia, unspecified: Secondary | ICD-10-CM

## 2013-02-12 DIAGNOSIS — I10 Essential (primary) hypertension: Secondary | ICD-10-CM

## 2013-02-12 DIAGNOSIS — E119 Type 2 diabetes mellitus without complications: Secondary | ICD-10-CM

## 2013-02-12 NOTE — Assessment & Plan Note (Signed)
Seems well-controlled, no change 

## 2013-02-12 NOTE — Assessment & Plan Note (Signed)
Mild carotid disease per Ultrasound 10/2012:  0-39% bilaterally

## 2013-02-12 NOTE — Assessment & Plan Note (Signed)
Better (compared to last visit, has a sporadic flareups. Plan:  Continue with judicious use of Robaxin, Mobic and oxycodone.  Oxycodone Rx elsewhere

## 2013-02-12 NOTE — Patient Instructions (Addendum)
  Continue with sporadic use of methocarbamol and meloxicam as needed, take meloxicam with food as it may irritate your stomach. Take oxycodone only for more severe pain. Next visit 4 months

## 2013-02-12 NOTE — Progress Notes (Signed)
  Subjective:    Patient ID: Edward Wilkerson, male    DOB: 1950/10/02, 62 y.o.   MRN: [id number]  HPI Routine office visit Back pain--improved compared to the last time he was here, still taking methocarbamol, meloxicam on OxyContin 1-2 tablets every other day. Dizziness decreased, having a sporadic symptoms Diabetes--does not check blood sugars although he does have a glucometer. Hypertension--good medication compliance, ambulatory BPs are normal.  Past Medical History  Diagnosis Date  . DM type 2 (diabetes mellitus, type 2)     bordeline  . Sleep apnea     could'nt tol CPAP (has a machine)   . Hypertension   . Hyperlipidemia   . Depression   . 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 RBBB (right bundle branch block with left anterior fascicular block) 2009    neg stress ECHO, saw cards  . Back pain     on-off even after surgery   Past Surgical History  Procedure Laterality Date  . Inguinal hernia repair  1977  . Appendectomy  1991  . Lumbar laminectomy  2001    Dr.Jenkins, still has mild pain  . Shoulder surgery  07/2008    (Right)     Review of Systems Diet-- a little more conscious about diet  Regular exercise-- no     Objective:   Physical Exam General -- alert, well-developed, NAD.  Lungs -- normal respiratory effort, no intercostal retractions, no accessory muscle use, and normal breath sounds.  Heart-- normal rate, regular rhythm, no murmur.   Extremities-- no pretibial edema bilaterally  Neurologic-- alert & oriented X3. Speech, gait normal. strength normal Psych-- Cognition and judgment appear intact. Alert and cooperative with normal attention span and concentration. not anxious appearing and not depressed appearing.      Assessment & Plan:

## 2013-02-12 NOTE — Assessment & Plan Note (Signed)
Last A1c very good, we discussed diet and exercise Next visit 4 months

## 2013-02-15 ENCOUNTER — Other Ambulatory Visit: Payer: Self-pay | Admitting: Internal Medicine

## 2013-02-16 NOTE — Telephone Encounter (Signed)
Meds filled

## 2013-03-01 ENCOUNTER — Telehealth: Payer: Self-pay | Admitting: *Deleted

## 2013-03-01 ENCOUNTER — Other Ambulatory Visit: Payer: Self-pay | Admitting: Internal Medicine

## 2013-03-01 DIAGNOSIS — R52 Pain, unspecified: Secondary | ICD-10-CM

## 2013-03-01 MED ORDER — METHOCARBAMOL 750 MG PO TABS: ORAL_TABLET | ORAL | Status: DC

## 2013-03-01 NOTE — Telephone Encounter (Signed)
rx refilled per protocol. DJR  

## 2013-03-01 NOTE — Telephone Encounter (Signed)
Refilled medication and informed patient via phone.

## 2013-03-01 NOTE — Telephone Encounter (Signed)
Okay to refill #45

## 2013-03-01 NOTE — Telephone Encounter (Signed)
Patient called and stated that he is having right arm/shoulder pain. He was seen by his surgeon and was told that he is having disc issues in his neck. An MRI was scheduled for Monday. He would like to know if he could have a refill on methocarbamol (ROBAXIN) 750 MG tablet. Please advise. JG

## 2013-06-16 ENCOUNTER — Other Ambulatory Visit: Payer: Self-pay | Admitting: Internal Medicine

## 2013-06-18 NOTE — Telephone Encounter (Signed)
Benicar refilled per protocol. JG//CMA

## 2013-08-15 ENCOUNTER — Ambulatory Visit (HOSPITAL_BASED_OUTPATIENT_CLINIC_OR_DEPARTMENT_OTHER)
Admission: RE | Admit: 2013-08-15 | Discharge: 2013-08-15 | Disposition: A | Discharge status: Home / Self Care | Payer: BC Managed Care – PPO | Source: Ambulatory Visit | Attending: Internal Medicine | Admitting: Internal Medicine

## 2013-08-15 ENCOUNTER — Encounter: Payer: Self-pay | Admitting: Internal Medicine

## 2013-08-15 ENCOUNTER — Encounter (INDEPENDENT_AMBULATORY_CARE_PROVIDER_SITE_OTHER): Payer: Self-pay

## 2013-08-15 ENCOUNTER — Ambulatory Visit (INDEPENDENT_AMBULATORY_CARE_PROVIDER_SITE_OTHER): Payer: BC Managed Care – PPO | Admitting: Internal Medicine

## 2013-08-15 VITALS — BP 118/74 | HR 99 | Temp 98.0°F | Wt 249.0 lb

## 2013-08-15 DIAGNOSIS — M7989 Other specified soft tissue disorders: Secondary | ICD-10-CM

## 2013-08-15 NOTE — Progress Notes (Signed)
Subjective:    Patient ID: Edward Wilkerson, male    DOB: Sep 28, 1950, 63 y.o.   MRN: [id number]  DOS:  08/15/2013 Type of  visit: Acute visit   He went to [place] the week of March 1; 2 days into the trip developed pain at the medial aspect of the left knee as well as swelling from the knee down to the ankle. He drove back 08/10/2013 and the next day he went to urgent care, by the time he was there the swelling was better and the pain decreased, they to have no means to do ultrasound so   was told he can seek help if not better. Overall  he feels better but is still concerned about the pain and swelling.     ROS Denies chest pain, difficulty breathing or palpitations. No fever, chills, leg redness or leg injury other than walking a lot during his trip. No history of previous clots.   Past Medical History  Diagnosis Date  . DM type 2 (diabetes mellitus, type 2)     bordeline  . Sleep apnea     could'nt tol CPAP (has a machine)   . Hypertension   . Hyperlipidemia   . Depression   . 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 RBBB (right bundle branch block with left anterior fascicular block) 2009    neg stress ECHO, saw cards  . Back pain     on-off even after surgery    Past Surgical History  Procedure Laterality Date  . Inguinal hernia repair  1977  . Appendectomy  1991  . Lumbar laminectomy  2001    Dr.Jenkins, still has mild pain  . Shoulder surgery  07/2008    (Right)    History   Social History  . Marital Status: Married    Spouse Name: N/A    Number of Children: 2  . Years of Education: N/A   Occupational History  . 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 Smoking status: Never Smoker   . Smokeless tobacco: Former Systems developer    Quit date: 09/21/1991  . Alcohol Use: 0.0 oz/week     Comment: beer socially  . Drug Use: No  . Sexual Activity: Not on file   Other Topics Concern  . Not on file   Social History Narrative  . No narrative on  file        Medication List       This list is accurate as of: 08/15/13 11:59 PM.  Always use your most recent med list.               atorvastatin 20 MG tablet  Commonly known as:  LIPITOR  Take 1 tablet (20 mg total) by mouth daily.     BENICAR HCT 40-25 MG per tablet  Generic drug:  olmesartan-hydrochlorothiazide  TAKE 1 TABLET DAILY     methocarbamol 750 MG tablet  Commonly known as:  ROBAXIN  TAKE ONE TABLET BY MOUTH THREE TIMES DAILY AS NEEDED     oxyCODONE-acetaminophen 5-325 MG per tablet  Commonly known as:  PERCOCET/ROXICET  Take 1 tablet by mouth every 4 (four) hours as needed. rx by another MD           Objective:   Physical Exam BP 118/74  Pulse 99  Temp(Src) 98 F (36.7 C)  Wt 249 lb (112.946 kg)  SpO2 96%  General -- alert, well-developed,  NAD.  Extremities--  no pretibial edema , calves are nontender, the left calf measures 1 cm more than the right. Knee itself is free of a joint effusion, range of motion is normal. Normal bilateral pedal pulses Neurologic--  alert & oriented X3. Speech normal, gait normal, strength normal in all extremities.  Psych-- Cognition and judgment appear intact. Cooperative with normal attention span and concentration. No anxious or depressed appearing.       Assessment & Plan:    Left leg swelling in the context of prolonged car trip. On exam there is no vascular insufficiency, calf is nontender but the left one is slightly bigger. We'll get the ultrasound today, if negative he probably had a sprain  or had a clot noting no symptoms consistent with PE . Recommend to call or seek care if he has chest pain, palpitations or difficulty breathing. Further advice w/ results. Addendum-- Ultrasound negative, spoke w/  the patient today, recommend to call if not better, take Motrin as needed. JP 08-16-13

## 2013-08-15 NOTE — Progress Notes (Signed)
Pre visit review using our clinic review tool, if applicable. No additional management support is needed unless otherwise documented below in the visit note. 

## 2013-08-15 NOTE — Patient Instructions (Signed)
Get the doppler  Call if swelling resurface or if you gave chest pain, difficulty breathing

## 2013-08-27 ENCOUNTER — Encounter: Payer: Self-pay | Admitting: Internal Medicine

## 2013-08-27 ENCOUNTER — Ambulatory Visit (INDEPENDENT_AMBULATORY_CARE_PROVIDER_SITE_OTHER): Payer: BC Managed Care – PPO | Admitting: Internal Medicine

## 2013-08-27 VITALS — BP 122/83 | HR 78 | Temp 97.1°F | Wt 248.2 lb

## 2013-08-27 DIAGNOSIS — M25562 Pain in left knee: Secondary | ICD-10-CM | POA: Insufficient documentation

## 2013-08-27 DIAGNOSIS — E119 Type 2 diabetes mellitus without complications: Secondary | ICD-10-CM

## 2013-08-27 DIAGNOSIS — M25569 Pain in unspecified knee: Secondary | ICD-10-CM

## 2013-08-27 DIAGNOSIS — I10 Essential (primary) hypertension: Secondary | ICD-10-CM

## 2013-08-27 NOTE — Patient Instructions (Signed)
Get your blood work before you leave   Next visit is for a physical exam by May or June 2015,  fasting Please make an appointment     Two great books to learn about diabetes Diabetes fro Dummies "The [facility] Diabetes diet" book  All about diabetes, great resource!  [email or web address].za.[email or web address]

## 2013-08-27 NOTE — Progress Notes (Signed)
Subjective:    Patient ID: Edward Wilkerson, male    DOB: 01-22-1951, 63 y.o.   MRN: [id number]  DOS:  08/27/2013 Type of  visit:  ROV Hypertension, good medication compliance, ambulatory BPs usually within normal, the patient does not have any specific readings for me. History of diabetes, on no medication, no ambulatory CBGs. Left knee pain, see previous visit,  developed knee pain and swelling after a trip to [place], ultrasound was negative for DVT, he continue with knee pain mostly at the inner aspect. Taking Motrin as needed.   ROS Diet is "okay". Has been unable to exercise lately. Denies any blurred vision. Saw the eye doctor about a year ago, reportedly had a normal exam. Denies chest pain or difficulty breathing. No nausea, vomiting, diarrhea    Past Medical History  Diagnosis Date  . DM type 2 (diabetes mellitus, type 2)     bordeline  . Sleep apnea     could'nt tol CPAP (has a machine)   . Hypertension   . Hyperlipidemia   . Depression   . 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 RBBB (right bundle branch block with left anterior fascicular block) 2009    neg stress ECHO, saw cards  . Back pain     on-off even after surgery    Past Surgical History  Procedure Laterality Date  . Inguinal hernia repair  1977  . Appendectomy  1991  . Lumbar laminectomy  2001    Dr.Jenkins, still has mild pain  . Shoulder surgery  07/2008    (Right)    History   Social History  . Marital Status: Married    Spouse Name: N/A    Number of Children: 2  . Years of Education: N/A   Occupational History  . 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 Smoking status: Never Smoker   . Smokeless tobacco: Former Systems developer    Quit date: 09/21/1991  . Alcohol Use: 0.0 oz/week     Comment: beer socially  . Drug Use: No  . Sexual Activity: Not on file   Other Topics Concern  . Not on file   Social History Narrative  . No narrative on file        Medication  List       This list is accurate as of: 08/27/13  6:47 PM.  Always use your most recent med list.               atorvastatin 20 MG tablet  Commonly known as:  LIPITOR  Take 1 tablet (20 mg total) by mouth daily.     BENICAR HCT 40-25 MG per tablet  Generic drug:  olmesartan-hydrochlorothiazide  TAKE 1 TABLET DAILY     oxyCODONE-acetaminophen 5-325 MG per tablet  Commonly known as:  PERCOCET/ROXICET  Take 1 tablet by mouth every 4 (four) hours as needed. rx by another MD           Objective:   Physical Exam BP 122/83  Pulse 78  Temp(Src) 97.1 F (36.2 C) (Oral)  Wt 248 lb 3.2 oz (112.583 kg)  SpO2 92%  General -- alert, well-developed, NAD.  HEENT-- Not pale.   Lungs -- normal respiratory effort, no intercostal retractions, no accessory muscle use, and normal breath sounds.  Heart-- normal rate, regular rhythm, no murmur.  Extremities-- no pretibial edema bilaterally ; Right knee normal. Left knee slightly tender in the inner  side, no obvious effusion, redness or swelling. Joint is stable. Neurologic--  alert & oriented X3. Speech normal, gait normal, strength normal in all extremities.  Psych-- Cognition and judgment appear intact. Cooperative with normal attention span and concentration. No anxious or depressed appearing.       Assessment & Plan:

## 2013-08-27 NOTE — Assessment & Plan Note (Signed)
Good compliance of medication, BP today is very good, check a BMP

## 2013-08-27 NOTE — Assessment & Plan Note (Addendum)
See previous office visit, knee pain after a trip to [place], ultrasound was negative for DVT. Plan: Ice, Motrin, refer to sports medicine

## 2013-08-27 NOTE — Assessment & Plan Note (Addendum)
History of diabetes, check a A1c. Reports an eye exam a year ago, reportedly okay, encourage the patient to have an eye exam yearly. Also encourage healthy diet, see instructions. Unable to exercise due to knee pain

## 2013-08-27 NOTE — Progress Notes (Signed)
Pre visit review using our clinic review tool, if applicable. No additional management support is needed unless otherwise documented below in the visit note. 

## 2013-08-28 ENCOUNTER — Ambulatory Visit (INDEPENDENT_AMBULATORY_CARE_PROVIDER_SITE_OTHER): Payer: BC Managed Care – PPO | Admitting: Family Medicine

## 2013-08-28 ENCOUNTER — Telehealth: Payer: Self-pay | Admitting: Internal Medicine

## 2013-08-28 ENCOUNTER — Other Ambulatory Visit (INDEPENDENT_AMBULATORY_CARE_PROVIDER_SITE_OTHER): Payer: BC Managed Care – PPO

## 2013-08-28 VITALS — BP 148/86 | HR 84

## 2013-08-28 DIAGNOSIS — M25562 Pain in left knee: Secondary | ICD-10-CM

## 2013-08-28 DIAGNOSIS — M25569 Pain in unspecified knee: Secondary | ICD-10-CM

## 2013-08-28 LAB — BASIC METABOLIC PANEL
BUN: 14 mg/dL (ref 6–23)
CALCIUM: 9.3 mg/dL (ref 8.4–10.5)
CO2: 23 mEq/L (ref 19–32)
CREATININE: 1 mg/dL (ref 0.4–1.5)
Chloride: 108 mEq/L (ref 96–112)
GFR: 82.26 mL/min (ref >60.00)
Glucose, Bld: 92 mg/dL (ref 70–99)
POTASSIUM: 4 meq/L (ref 3.5–5.1)
Sodium: 140 mEq/L (ref 135–145)

## 2013-08-28 LAB — HEMOGLOBIN A1C: HEMOGLOBIN A1C: 6.6 % — AB (ref 4.6–6.5)

## 2013-08-28 MED ORDER — MELOXICAM 15 MG PO TABS: 15.0000 mg | ORAL_TABLET | Freq: Every day | ORAL | Status: DC

## 2013-08-28 NOTE — Patient Instructions (Signed)
Good to meet you Ice 20 minutes 2 times a day meloxicam daily for 10 days then as needed Exercises 3 times a day Avoid squatting deep. No ladders  Come back in 3 weeks.

## 2013-08-28 NOTE — Progress Notes (Signed)
  [facility] [address], [place] [address] Phone: [phone] Subjective:    I'm seeing this patient by the request  of:  Kathlene November, MD   CC: Left knee pain  TDV:[id number] Edward Wilkerson is a 63 y.o. male coming in with complaint of  Left knee pain. Patient states it started after having a long trip to [place]. Patient was walking a lot and states that after walking first significant amount time he started having medial knee pain. Patient states it seemed to go posterior as well. Patient denies any locking and giving on him but have been a dull aching sensation at baseline. Patient states over time it seems to be improving slowly but not as quickly as usual aches and pains. Patient denies any significant radiation down the leg or up towards the groin. Patient rates the severity today of 2/10 but has been a severest than previously. Denies any swelling. Denies any nighttime awakening.    Past medical history, social, surgical and family history all reviewed in electronic medical record.   Review of Systems: No headache, visual changes, nausea, vomiting, diarrhea, constipation, dizziness, abdominal pain, skin rash, fevers, chills, night sweats, weight loss, swollen lymph nodes, body aches, joint swelling, muscle aches, chest pain, shortness of breath, mood changes.   Objective Blood pressure 148/86, pulse 84, SpO2 98.00%.  General: No apparent distress alert and oriented x3 mood and affect normal, dressed appropriately.  HEENT: Pupils equal, extraocular movements intact  Respiratory: Patient's speak in full sentences and does not appear short of breath  Cardiovascular: No lower extremity edema, non tender, no erythema  Skin: Warm dry intact with no signs of infection or rash on extremities or on axial skeleton.  Abdomen: Soft nontender  Neuro: Cranial nerves II through XII are intact, neurovascularly intact in all extremities with 2+ DTRs and 2+  pulses.  Lymph: No lymphadenopathy of posterior or anterior cervical chain or axillae bilaterally.  Gait normal with good balance and coordination.  MSK:  Non tender with full range of motion and good stability and symmetric strength and tone of shoulders, elbows, wrist, hip,  and ankles bilaterally.  Knee: Left Normal to inspection with no erythema or effusion or obvious bony abnormalities. Palpation normal with no warmth, patellar tenderness, or condyle tenderness. Mild joint line tenderness ROM full in flexion and extension and lower leg rotation. Ligaments with solid consistent endpoints including ACL, PCL, LCL, MCL. Negative Mcmurray's, Apley's, and Thessalonian tests. Non painful patellar compression. Patellar glide without crepitus. Patellar and quadriceps tendons unremarkable. Hamstring and quadriceps strength is normal.   MSK US performed of: Left knee This study was ordered, performed, and interpreted by Charlann Boxer D.O.  Knee: All structures visualized. Anteromedial do some mild degenerative changes but no significant displacement. Mild narrowing of the medial joint line Anterolateral, posteromedial, and posterolateral menisci unremarkable without tearing, fraying, effusion, or displacement. Patellar Tendon unremarkable on long and transverse views without effusion. No abnormality of prepatellar bursa. LCL and MCL unremarkable on long and transverse views. No abnormality of origin of medial or lateral head of the gastrocnemius.  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     Impression and Recommendations:     This case required medical decision making of moderate complexity.

## 2013-08-28 NOTE — Telephone Encounter (Signed)
Relevant patient education assigned to patient using Emmi. ° °

## 2013-08-29 ENCOUNTER — Encounter: Payer: Self-pay | Admitting: Family Medicine

## 2013-08-29 NOTE — Assessment & Plan Note (Signed)
Discussed different treatment options Discussed conservative therapy and will do compression, meloxicam , icing and HEP If not better will return in 3 weeks, will consider injection and xrays.

## 2013-09-13 ENCOUNTER — Other Ambulatory Visit: Payer: Self-pay

## 2013-09-18 ENCOUNTER — Ambulatory Visit (INDEPENDENT_AMBULATORY_CARE_PROVIDER_SITE_OTHER)
Admission: RE | Admit: 2013-09-18 | Discharge: 2013-09-18 | Disposition: A | Discharge status: Home / Self Care | Payer: BC Managed Care – PPO | Source: Ambulatory Visit | Attending: Family Medicine | Admitting: Family Medicine

## 2013-09-18 ENCOUNTER — Encounter: Payer: Self-pay | Admitting: *Deleted

## 2013-09-18 ENCOUNTER — Ambulatory Visit (INDEPENDENT_AMBULATORY_CARE_PROVIDER_SITE_OTHER): Payer: BC Managed Care – PPO | Admitting: Family Medicine

## 2013-09-18 ENCOUNTER — Encounter: Payer: Self-pay | Admitting: Family Medicine

## 2013-09-18 VITALS — BP 130/80 | HR 84

## 2013-09-18 DIAGNOSIS — M25562 Pain in left knee: Secondary | ICD-10-CM

## 2013-09-18 DIAGNOSIS — M25569 Pain in unspecified knee: Secondary | ICD-10-CM

## 2013-09-18 NOTE — Patient Instructions (Signed)
Good to see you Ice still after activity We will decrease what you are doing at work for next 2 weeks.  Continue exercises 3 times a week Physical therapy will be calling you.  Tylenol 650mg  three times a day Vitamin D 2000 IU daily Turmeric 500mg  twice daily.  Xrays downstairs today.  I want to see you in 2 weeks.

## 2013-09-18 NOTE — Assessment & Plan Note (Signed)
Patient continues to have trouble overall. Spent greater than 25 minutes with patient face-to-face and had greater than 50% of counseling including as described above in assessment and plan. 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 Discuss x-rays to get baseline which patient did today. We discussed icing protocol and continue in the brace. Patient will be sent to formal physical therapy as well for underlying osteoarthritic changes of the knee as well as a chronic meniscal tear. Patient will try these interventions and we also given him a note to avoid traveling with work for 2 weeks. Patient will come back in 2 weeks for further evaluation and if he continues to have pain I would consider doing a corticosteroid injection. Patient declined this intervention today.

## 2013-09-18 NOTE — Progress Notes (Signed)
  [facility] [address] [place],  [address] Phone: [phone] Subjective:     CC: Left knee pain followup  LOV:[id number] Edward Wilkerson is a 63 y.o. male coming in with complaint of  Left knee pain. 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 Patient chose to do conservative therapy with anti-inflammatories icing and home exercises. Patient states he is only approximately 10% better. Patient continues take meloxicam. Patient states it is not worse. Patient states on days when he is not working Edward Wilkerson seems to be significantly better. Patient job and called for him to travel and get in and out of his car multiple times it seems to aggravate his knee. Patient denies any popping clicking or giving on him. He can has been doing the home exercises intermittently.  Past medical history, social, surgical and family history all reviewed in electronic medical record.   Review of Systems: No headache, visual changes, nausea, vomiting, diarrhea, constipation, dizziness, abdominal pain, skin rash, fevers, chills, night sweats, weight loss, swollen lymph nodes, body aches, joint swelling, muscle aches, chest pain, shortness of breath, mood changes.   Objective Blood pressure 130/80, pulse 84, SpO2 98.00%.  General: No apparent distress alert and oriented x3 mood and affect normal, dressed appropriately.  HEENT: Pupils equal, extraocular movements intact  Respiratory: Patient's speak in full sentences and does not appear short of breath  Cardiovascular: No lower extremity edema, non tender, no erythema  Skin: Warm dry intact with no signs of infection or rash on extremities or on axial skeleton.  Abdomen: Soft nontender  Neuro: Cranial nerves II through XII are intact, neurovascularly intact in all extremities with 2+ DTRs and 2+ pulses.  Lymph: No lymphadenopathy of  posterior or anterior cervical chain or axillae bilaterally.  Gait normal with good balance and coordination.  MSK:  Non tender with full range of motion and good stability and symmetric strength and tone of shoulders, elbows, wrist, hip,  and ankles bilaterally.  Knee: Left Normal to inspection with no erythema or effusion or obvious bony abnormalities. Palpation normal with no warmth, patellar tenderness, or condyle tenderness. Mild joint line tenderness still present ROM full in flexion and extension and lower leg rotation. Ligaments with solid consistent endpoints including ACL, PCL, LCL, MCL. Mild positive Mcmurray's, Apley's, and Thessalonian tests. Non painful patellar compression. Patellar glide without crepitus. Patellar and quadriceps tendons unremarkable. Hamstring and quadriceps strength is normal.     Impression and Recommendations:     This case required medical decision making of moderate complexity.

## 2013-09-26 ENCOUNTER — Other Ambulatory Visit: Payer: Self-pay | Admitting: Family Medicine

## 2013-09-28 NOTE — Telephone Encounter (Signed)
Refill done.  

## 2013-10-02 ENCOUNTER — Encounter: Payer: Self-pay | Admitting: Family Medicine

## 2013-10-02 ENCOUNTER — Ambulatory Visit (INDEPENDENT_AMBULATORY_CARE_PROVIDER_SITE_OTHER): Payer: BC Managed Care – PPO | Admitting: Family Medicine

## 2013-10-02 VITALS — BP 126/84 | HR 73

## 2013-10-02 DIAGNOSIS — M25569 Pain in unspecified knee: Secondary | ICD-10-CM

## 2013-10-02 DIAGNOSIS — M25562 Pain in left knee: Secondary | ICD-10-CM

## 2013-10-02 NOTE — Patient Instructions (Signed)
It is great to see you doing so well Continue the exercises 2-3 times a week for next 6 weeks.  Ice is your best friend.  Continue the meds See me in 4 weeks if not perfect.

## 2013-10-02 NOTE — Progress Notes (Signed)
  [name] [address] [place], [place] [address] Phone: [phone] Subjective:     CC: Left knee pain followup  Edward Wilkerson is a 63 y.o. male coming in with complaint of  Left knee pain. 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 Patient is continuing with physical exercise at home as well as home exercise program in addition to a icing protocol as well as bracing. Patient was sent to formal physical therapy. Patient has been on a work restriction with no traveling for the last 2 weeks. Patient states he has not felt this good since his injury. Patient states he is able to do all activities and is working regularly. Patient remembers to do the exercises seldomly. Patient though does the icing and the over-the-counter medications which has made significant improvement. Patient denies any new symptoms.  Past medical history, social, surgical and family history all reviewed in electronic medical record.   Review of Systems: No headache, visual changes, nausea, vomiting, diarrhea, constipation, dizziness, abdominal pain, skin rash, fevers, chills, night sweats, weight loss, swollen lymph nodes, body aches, joint swelling, muscle aches, chest pain, shortness of breath, mood changes.   Objective Blood pressure 126/84, pulse 73, SpO2 98.00%.  General: No apparent distress alert and oriented x3 mood and affect normal, dressed appropriately.  HEENT: Pupils equal, extraocular movements intact  Respiratory: Patient's speak in full sentences and does not appear short of breath  Cardiovascular: No lower extremity edema, non tender, no erythema  Skin: Warm dry intact with no signs of infection or rash on extremities or on axial skeleton.  Abdomen: Soft nontender  Neuro: Cranial nerves II through XII are intact, neurovascularly intact in all extremities with  2+ DTRs and 2+ pulses.  Lymph: No lymphadenopathy of posterior or anterior cervical chain or axillae bilaterally.  Gait normal with good balance and coordination.  MSK:  Non tender with full range of motion and good stability and symmetric strength and tone of shoulders, elbows, wrist, hip,  and ankles bilaterally.  Knee: Left Normal to inspection with no erythema or effusion or obvious bony abnormalities. Palpation normal with no warmth, patellar tenderness, or condyle tenderness. Mild joint line tenderness still present but significantly less ROM full in flexion and extension and lower leg rotation. Ligaments with solid consistent endpoints including ACL, PCL, LCL, MCL. Negative Mcmurray's, Apley's, and Thessalonian tests. Non painful patellar compression. Patellar glide without crepitus. Patellar and quadriceps tendons unremarkable. Hamstring and quadriceps strength is normal.     Impression and Recommendations:     This case required medical decision making of moderate complexity.

## 2013-10-02 NOTE — Assessment & Plan Note (Signed)
Patient is doing better with conservative therapy. Encourage patient to continue exercising 2-3 times a week, icing, and the over-the-counter medications. Patient as long as he continues to improve will follow up again in 4 weeks for further evaluation. Patient has any worsening pain or does not make any improvement then corticosteroid injection would be warranted.

## 2013-10-19 ENCOUNTER — Other Ambulatory Visit: Payer: Self-pay | Admitting: Internal Medicine

## 2013-10-22 ENCOUNTER — Telehealth: Payer: Self-pay | Admitting: Internal Medicine

## 2013-10-22 NOTE — Telephone Encounter (Signed)
Caller name: Garnett Ronalee Belts)  Call back [phone]  Reason for call:  Pt received a letter in the mail from the [facility] about getting an Echo done.  Pt had one done last year on 10/20/2012 and wants to see if this is just a follow letter or if he really needs to go have another one done.  Please contact to advise.

## 2013-10-22 NOTE — Telephone Encounter (Signed)
Left message on pts voice mail to complete echo as requested.

## 2013-10-25 ENCOUNTER — Ambulatory Visit: Payer: BC Managed Care – PPO | Admitting: Family Medicine

## 2013-10-26 ENCOUNTER — Ambulatory Visit (INDEPENDENT_AMBULATORY_CARE_PROVIDER_SITE_OTHER): Payer: BC Managed Care – PPO | Admitting: Family Medicine

## 2013-10-26 ENCOUNTER — Other Ambulatory Visit (INDEPENDENT_AMBULATORY_CARE_PROVIDER_SITE_OTHER): Payer: BC Managed Care – PPO

## 2013-10-26 ENCOUNTER — Encounter: Payer: Self-pay | Admitting: Family Medicine

## 2013-10-26 VITALS — BP 140/84 | HR 94 | Ht 71.75 in | Wt 248.0 lb

## 2013-10-26 DIAGNOSIS — M25562 Pain in left knee: Secondary | ICD-10-CM

## 2013-10-26 DIAGNOSIS — M25569 Pain in unspecified knee: Secondary | ICD-10-CM

## 2013-10-26 NOTE — Patient Instructions (Signed)
It is good to see you as always.  Try to stop the brace with short activity Stop the meloxicam If pain comes back then start the tylenol again and do 10 day burst of the meloxicam.  Exercises 3 times a week for another 2 weeks.  See me when you need me.

## 2013-10-26 NOTE — Progress Notes (Signed)
  [facility] [address], [place] [address] Phone: [phone] Subjective:     CC: Left knee pain followup  [id number] Edward Wilkerson is a 63 y.o. male coming in with complaint of  Left knee pain. 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 Patient has continued conservative therapy including home exercises bracing and icing. Patient was doing better at last followup. Patient states she has not had any pain for the last 2 weeks. Patient has been able to do all activities of daily living. Patient continues of meloxicam but has stopped the Tylenol. Patient continues to wear the brace. Patient denies any nighttime awakening. Patient feels as good as he had in multiple months.  Past medical history, social, surgical and family history all reviewed in electronic medical record.   Review of Systems: No headache, visual changes, nausea, vomiting, diarrhea, constipation, dizziness, abdominal pain, skin rash, fevers, chills, night sweats, weight loss, swollen lymph nodes, body aches, joint swelling, muscle aches, chest pain, shortness of breath, mood changes.   Objective Blood pressure 140/84, pulse 94, height 5' 11.75" (1.822 m), weight 248 lb (112.492 kg), SpO2 96.00%.  General: No apparent distress alert and oriented x3 mood and affect normal, dressed appropriately.  HEENT: Pupils equal, extraocular movements intact  Respiratory: Patient's speak in full sentences and does not appear short of breath  Cardiovascular: No lower extremity edema, non tender, no erythema  Skin: Warm dry intact with no signs of infection or rash on extremities or on axial skeleton.  Abdomen: Soft nontender  Neuro: Cranial nerves II through XII are intact, neurovascularly intact in all extremities with 2+ DTRs and 2+ pulses.  Lymph: No lymphadenopathy of posterior or anterior cervical  chain or axillae bilaterally.  Gait normal with good balance and coordination.  MSK:  Non tender with full range of motion and good stability and symmetric strength and tone of shoulders, elbows, wrist, hip,  and ankles bilaterally.  Knee: Left Normal to inspection with no erythema or effusion or obvious bony abnormalities. Palpation normal with no warmth, patellar tenderness, or condyle tenderness. Nontender exam ROM full in flexion and extension and lower leg rotation. Ligaments with solid consistent endpoints including ACL, PCL, LCL, MCL. Negative Mcmurray's, Apley's, and Thessalonian tests. Non painful patellar compression. Patellar glide without crepitus. Patellar and quadriceps tendons unremarkable. Hamstring and quadriceps strength is normal.   MSK US performed of: Left knee This study was ordered, performed, and interpreted by Charlann Boxer D.O.  Knee: All structures visualized. Anteromedial meniscus does still have a degenerative tear but no hypoechoic changes surrounding the area and it does appear to be healing. Mild narrowing of the joint space still noted. Anterolateral, posteromedial, and posterolateral menisci unremarkable without tearing, fraying, effusion, or displacement. Patellar Tendon unremarkable on long and transverse views without effusion. No abnormality of prepatellar bursa. LCL and MCL unremarkable on long and transverse views. No abnormality of origin of medial or lateral head of the gastrocnemius.  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    Impression and Recommendations:     This case required medical decision making of moderate complexity.

## 2013-10-26 NOTE — Assessment & Plan Note (Signed)
Patient has unremarkably well. Patient is now pain free. Encourage patient to try to decrease his meloxicam to every other day and then stopped over the course of some time. We discussed continuing icing at night and brace as needed. Patient will continue the exercise program. Patient can followup on an as-needed basis.

## 2013-10-30 ENCOUNTER — Ambulatory Visit: Payer: BC Managed Care – PPO | Admitting: Family Medicine

## 2013-11-12 ENCOUNTER — Other Ambulatory Visit (HOSPITAL_COMMUNITY): Payer: Self-pay | Admitting: Cardiology

## 2013-11-12 DIAGNOSIS — I6529 Occlusion and stenosis of unspecified carotid artery: Secondary | ICD-10-CM

## 2013-11-14 ENCOUNTER — Ambulatory Visit (HOSPITAL_COMMUNITY): Payer: BC Managed Care – PPO | Attending: Cardiology | Admitting: Cardiology

## 2013-11-14 DIAGNOSIS — I658 Occlusion and stenosis of other precerebral arteries: Secondary | ICD-10-CM | POA: Insufficient documentation

## 2013-11-14 DIAGNOSIS — I6529 Occlusion and stenosis of unspecified carotid artery: Secondary | ICD-10-CM | POA: Insufficient documentation

## 2013-11-14 DIAGNOSIS — I10 Essential (primary) hypertension: Secondary | ICD-10-CM | POA: Insufficient documentation

## 2013-11-14 DIAGNOSIS — E785 Hyperlipidemia, unspecified: Secondary | ICD-10-CM | POA: Insufficient documentation

## 2013-11-14 DIAGNOSIS — Z87891 Personal history of nicotine dependence: Secondary | ICD-10-CM | POA: Insufficient documentation

## 2013-11-14 DIAGNOSIS — E119 Type 2 diabetes mellitus without complications: Secondary | ICD-10-CM | POA: Insufficient documentation

## 2013-11-14 NOTE — Progress Notes (Signed)
Carotid duplex completed 

## 2013-11-23 ENCOUNTER — Encounter: Payer: BC Managed Care – PPO | Admitting: Internal Medicine

## 2013-12-11 ENCOUNTER — Encounter: Payer: Self-pay | Admitting: Internal Medicine

## 2013-12-11 ENCOUNTER — Ambulatory Visit (INDEPENDENT_AMBULATORY_CARE_PROVIDER_SITE_OTHER): Payer: BC Managed Care – PPO | Admitting: Internal Medicine

## 2013-12-11 VITALS — BP 112/74 | HR 88 | Temp 97.6°F | Wt 256.0 lb

## 2013-12-11 DIAGNOSIS — M549 Dorsalgia, unspecified: Secondary | ICD-10-CM

## 2013-12-11 MED ORDER — CYCLOBENZAPRINE HCL 10 MG PO TABS: 10.0000 mg | ORAL_TABLET | Freq: Two times a day (BID) | ORAL | Status: AC | PRN

## 2013-12-11 NOTE — Progress Notes (Signed)
Subjective:    Patient ID: Edward Wilkerson, male    DOB: 1950-11-20, 63 y.o.   MRN: [id number]  DOS:  12/11/2013 Type of visit - description: acute History: He did some yard work 4 days ago, and the next day he was leaning over the sink and developed left-sided back pain, no radiation, his taking Aleve and is feeling slightly better today. The patient thinks  is a flareup of his on and off back pain.   ROS Denies fever or chills No bladder or bowel incontinence No lower extremity paresthesias No dysuria or gross hematuria  Past Medical History  Diagnosis Date  . DM type 2 (diabetes mellitus, type 2)     bordeline  . Sleep apnea     could'nt tol CPAP (has a machine)   . Hypertension   . Hyperlipidemia   . Depression   . 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 RBBB (right bundle branch block with left anterior fascicular block) 2009    neg stress ECHO, saw cards  . Back pain     on-off even after surgery    Past Surgical History  Procedure Laterality Date  . Inguinal hernia repair  1977  . Appendectomy  1991  . Lumbar laminectomy  2001    Dr.Jenkins, still has mild pain  . Shoulder surgery  07/2008    (Right)    History   Social History  . Marital Status: Married    Spouse Name: N/A    Number of Children: 2  . Years of Education: N/A   Occupational History  . 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 Smoking status: Never Smoker   . Smokeless tobacco: Former Systems developer    Quit date: 09/21/1991  . Alcohol Use: 0.0 oz/week     Comment: beer socially  . Drug Use: No  . Sexual Activity: Not on file   Other Topics Concern  . Not on file   Social History Narrative  . No narrative on file        Medication List       This list is accurate as of: 12/11/13  6:12 PM.  Always use your most recent med list.               atorvastatin 20 MG tablet  Commonly known as:  LIPITOR  TAKE 1 TABLET DAILY     BENICAR HCT 40-25 MG per tablet  Generic  drug:  olmesartan-hydrochlorothiazide  TAKE 1 TABLET DAILY     cyclobenzaprine 10 MG tablet  Commonly known as:  FLEXERIL  Take 1 tablet (10 mg total) by mouth 2 (two) times daily as needed for muscle spasms.     oxyCODONE-acetaminophen 5-325 MG per tablet  Commonly known as:  PERCOCET/ROXICET  Take 1 tablet by mouth every 4 (four) hours as needed. rx by another MD           Objective:   Physical Exam BP 112/74  Pulse 88  Temp(Src) 97.6 F (36.4 C) (Oral)  Wt 256 lb (116.121 kg)  SpO2 98% General -- alert, well-developed, NAD.  Extremities-- no pretibial edema bilaterally  Neurologic--  alert & oriented X3. Speech normal, gait appropriate for age, posture slt antalgic; strength symmetric and appropriate for age.  DTRs symmetric. Back -- no TTP  Psych-- Cognition and judgment appear intact. Cooperative with normal attention span and concentration. No anxious or depressed appearing.  Assessment & Plan:

## 2013-12-11 NOTE — Progress Notes (Signed)
Pre visit review using our clinic review tool, if applicable. No additional management support is needed unless otherwise documented below in the visit note. 

## 2013-12-11 NOTE — Assessment & Plan Note (Signed)
Presents with a back pain exacerbation. See instructions. In addition we discussed possibly formal physical therapy, the patient will call me when ready.

## 2013-12-11 NOTE — Patient Instructions (Signed)
Rest Use a heating pad Take Aleve OTC as needed, watch for stomach side effects Flexeril twice a day, this is a muscle relaxant, watch for feeling sleepy If the pain is more intense take OxyContin Stretch daily, see below. Call if not improving in the next week   [place] with information about home physical therapy for back pain: [email or web address]

## 2013-12-25 ENCOUNTER — Telehealth: Payer: Self-pay

## 2013-12-25 DIAGNOSIS — E785 Hyperlipidemia, unspecified: Secondary | ICD-10-CM

## 2013-12-25 NOTE — Telephone Encounter (Signed)
Diabetic Bundle  Pt has appt on 01-29-14  Pt needs lipid panel (none in chart)  Lab order placed

## 2014-01-29 ENCOUNTER — Ambulatory Visit (INDEPENDENT_AMBULATORY_CARE_PROVIDER_SITE_OTHER): Payer: BC Managed Care – PPO | Admitting: Internal Medicine

## 2014-01-29 ENCOUNTER — Encounter: Payer: Self-pay | Admitting: Internal Medicine

## 2014-01-29 VITALS — BP 134/64 | HR 94 | Temp 97.7°F | Ht 71.0 in | Wt 242.2 lb

## 2014-01-29 DIAGNOSIS — Z23 Encounter for immunization: Secondary | ICD-10-CM

## 2014-01-29 DIAGNOSIS — F329 Major depressive disorder, single episode, unspecified: Secondary | ICD-10-CM

## 2014-01-29 DIAGNOSIS — Z0000 Encounter for general adult medical examination without abnormal findings: Secondary | ICD-10-CM

## 2014-01-29 DIAGNOSIS — E119 Type 2 diabetes mellitus without complications: Secondary | ICD-10-CM

## 2014-01-29 DIAGNOSIS — I10 Essential (primary) hypertension: Secondary | ICD-10-CM

## 2014-01-29 DIAGNOSIS — F3289 Other specified depressive episodes: Secondary | ICD-10-CM

## 2014-01-29 NOTE — Patient Instructions (Signed)
Get your blood work before you leave     Next visit is for routine check up regards your blood sugar , blood pressure in 6 months  No need to come back fasting Please make an appointment

## 2014-01-29 NOTE — Progress Notes (Addendum)
Subjective:    Patient ID: Edward Wilkerson, male    DOB: 28-Jun-1950, 63 y.o.   MRN: [id number]  DOS:  01/29/2014 Type of visit - description: CPX    ROS No  CP, SOB No palpitations, no lower extremity edema Denies  nausea, vomiting diarrhea, blood in the stools (-) cough, sputum production (-) wheezing, chest congestion No dysuria, gross hematuria, difficulty urinating  No anxiety, depression  Past Medical History  Diagnosis Date  . DM type 2 (diabetes mellitus, type 2)     bordeline  . Sleep apnea     could'nt tol CPAP (has a machine)   . Hypertension   . Hyperlipidemia   . Depression   . 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 RBBB (right bundle branch block with left anterior fascicular block) 2009    neg stress ECHO, saw cards  . Back pain     on-off even after surgery    Past Surgical History  Procedure Laterality Date  . Inguinal hernia repair  1977  . Appendectomy  1991  . Lumbar laminectomy  2001    Dr.Jenkins, still has mild pain  . Shoulder surgery  07/2008    (Right)    History   Social History  . Marital Status: Married    Spouse Name: N/A    Number of Children: 2  . Years of Education: N/A   Occupational History  . 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 Smoking status: Never Smoker   . Smokeless tobacco: Former Systems developer    Quit date: 09/21/1991  . Alcohol Use: 0.0 oz/week     Comment: beer socially  . Drug Use: No  . Sexual Activity: Not on file   Other Topics Concern  . Not on file   Social History Narrative  . No narrative on file     Family History  Problem Relation Age of Onset  . Coronary artery disease Father   . Coronary artery disease Brother   . Heart attack Father 26    deceased  . Hypertension Mother   . Diabetes Mother   . Diabetes Sister   . Stroke Sister     deceased   . Breast cancer Sister   . Cirrhosis Sister     no ETOH  . Colon cancer Neg Hx   . Prostate cancer Neg Hx   . Colon polyps  Other     many fam members       Medication List       This list is accurate as of: 01/29/14 11:59 PM.  Always use your most recent med list.               atorvastatin 20 MG tablet  Commonly known as:  LIPITOR  TAKE 1 TABLET DAILY     BENICAR HCT 40-25 MG per tablet  Generic drug:  olmesartan-hydrochlorothiazide  TAKE 1 TABLET DAILY     cyclobenzaprine 10 MG tablet  Commonly known as:  FLEXERIL  Take 1 tablet (10 mg total) by mouth 2 (two) times daily as needed for muscle spasms.     oxyCODONE-acetaminophen 5-325 MG per tablet  Commonly known as:  PERCOCET/ROXICET  Take 1 tablet by mouth every 4 (four) hours as needed. rx by another MD           Objective:   Physical Exam BP 134/64  Pulse 94  Temp(Src) 97.7 F (36.5 C) (Oral)  Ht 5\' 11"  (1.803 m)  Wt 242 lb 4 oz (109.884 kg)  BMI 33.80 kg/m2  SpO2 95% General -- alert, well-developed, NAD.  Neck --no thyromegaly  HEENT-- Not pale.  Lungs -- normal respiratory effort, no intercostal retractions, no accessory muscle use, and normal breath sounds.  Heart-- normal rate, regular rhythm, no murmur.  Abdomen-- Not distended, good bowel sounds,soft, non-tender. Extremities-- no pretibial edema bilaterally  Neurologic--  alert & oriented X3. Speech normal, gait appropriate for age, strength symmetric and appropriate for age.  Psych-- Cognition and judgment appear intact. Cooperative with normal attention span and concentration. No anxious or depressed appearing.     Assessment & Plan:

## 2014-01-29 NOTE — Assessment & Plan Note (Signed)
On no meds medications, last A1c satisfactory. Had a negative eye check 2 months ago. Plan: Return to the office in 6 months

## 2014-01-29 NOTE — Assessment & Plan Note (Signed)
Well-controlled at the present

## 2014-01-29 NOTE — Assessment & Plan Note (Addendum)
Td 2015 PNM 23 and zostavax discussed . Likes to think about +FH CAD-DM-stroke-colon polyps  3th Cscope  per pt was 2010, next due (to have it in few days)  Prostate cancer screening, every 2 years,  PSA- DRE normal last year, next 2016 Labs counseled about diet-exercise

## 2014-01-29 NOTE — Assessment & Plan Note (Signed)
Not an issue at this point 

## 2014-01-29 NOTE — Progress Notes (Signed)
Pre-visit discussion using our clinic review tool. No additional management support is needed unless otherwise documented below in the visit note.  

## 2014-01-30 LAB — LIPID PANEL
CHOL/HDL RATIO: 3
Cholesterol: 163 mg/dL (ref 0–200)
HDL: 52.1 mg/dL (ref >39.00)
LDL Cholesterol: 82 mg/dL (ref 0–99)
NONHDL: 110.9
TRIGLYCERIDES: 146 mg/dL (ref 0.0–149.0)
VLDL: 29.2 mg/dL (ref 0.0–40.0)

## 2014-01-30 LAB — COMPREHENSIVE METABOLIC PANEL
ALK PHOS: 59 U/L (ref 39–117)
ALT: 41 U/L (ref 0–53)
AST: 34 U/L (ref 0–37)
Albumin: 4.4 g/dL (ref 3.5–5.2)
BILIRUBIN TOTAL: 1 mg/dL (ref 0.2–1.2)
BUN: 17 mg/dL (ref 6–23)
CHLORIDE: 104 meq/L (ref 96–112)
CO2: 24 mEq/L (ref 19–32)
CREATININE: 1.1 mg/dL (ref 0.4–1.5)
Calcium: 9.7 mg/dL (ref 8.4–10.5)
GFR: 71.14 mL/min (ref >60.00)
Glucose, Bld: 108 mg/dL — ABNORMAL HIGH (ref 70–99)
Potassium: 4.3 mEq/L (ref 3.5–5.1)
Sodium: 138 mEq/L (ref 135–145)
Total Protein: 7.2 g/dL (ref 6.0–8.3)

## 2014-01-30 LAB — CBC WITH DIFFERENTIAL/PLATELET
BASOS PCT: 0.6 % (ref 0.0–3.0)
Basophils Absolute: 0 10*3/uL (ref 0.0–0.1)
EOS PCT: 2.5 % (ref 0.0–5.0)
Eosinophils Absolute: 0.2 10*3/uL (ref 0.0–0.7)
HEMATOCRIT: 48.2 % (ref 39.0–52.0)
HEMOGLOBIN: 16.7 g/dL (ref 13.0–17.0)
Lymphocytes Relative: 29.5 % (ref 12.0–46.0)
Lymphs Abs: 2.1 10*3/uL (ref 0.7–4.0)
MCHC: 34.7 g/dL (ref 30.0–36.0)
MCV: 90.5 fl (ref 78.0–100.0)
MONO ABS: 0.5 10*3/uL (ref 0.1–1.0)
Monocytes Relative: 7.6 % (ref 3.0–12.0)
NEUTROS PCT: 59.8 % (ref 43.0–77.0)
Neutro Abs: 4.2 10*3/uL (ref 1.4–7.7)
Platelets: 129 10*3/uL — ABNORMAL LOW (ref 150.0–400.0)
RBC: 5.32 Mil/uL (ref 4.22–5.81)
RDW: 13 % (ref 11.5–15.5)
WBC: 7 10*3/uL (ref 4.0–10.5)

## 2014-01-30 LAB — HEMOGLOBIN A1C: Hgb A1c MFr Bld: 6 % (ref 4.6–6.5)

## 2014-02-01 LAB — HM COLONOSCOPY

## 2014-04-03 ENCOUNTER — Other Ambulatory Visit: Payer: Self-pay | Admitting: Internal Medicine

## 2014-04-17 ENCOUNTER — Telehealth: Payer: Self-pay | Admitting: Internal Medicine

## 2014-04-17 MED ORDER — ATORVASTATIN CALCIUM 20 MG PO TABS: ORAL_TABLET | ORAL | Status: DC

## 2014-04-17 NOTE — Telephone Encounter (Signed)
30 day supply sent to [facility] as requested.

## 2014-04-17 NOTE — Telephone Encounter (Signed)
Caller name:Strothman, Mallie Mussel Relation to XY:IAXK Call back [phone] Pharmacy:[email or web address]  Reason for call: pt states that he has not received his meds yet from future scripts, and he thinks its lost because he moved, states he received confirmation that it was shipped however he still has not gotten it yet. Pt states he only has 3 tabs left of atorvastatin (LIPITOR) 20 MG tablet wants to know if dr. 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

## 2014-04-19 ENCOUNTER — Telehealth: Payer: Self-pay | Admitting: Internal Medicine

## 2014-04-19 NOTE — Telephone Encounter (Signed)
Caller name: Krishna, Dancel Relation to pt: self  Call back number: [phone] Pharmacy:  Reason for call: pt requesting a refill of atorvastatin (LIPITOR) 20 MG tablet, future scripts mailed to old address and it was never (pt has only 1 cholo pill left)

## 2014-04-19 NOTE — Telephone Encounter (Signed)
Error.   See 04/17/14. This has been handled

## 2014-08-01 ENCOUNTER — Ambulatory Visit: Payer: BC Managed Care – PPO | Admitting: Internal Medicine

## 2014-08-02 ENCOUNTER — Ambulatory Visit: Payer: Self-pay | Admitting: Internal Medicine

## 2014-08-06 ENCOUNTER — Encounter: Payer: Self-pay | Admitting: Internal Medicine

## 2014-08-06 ENCOUNTER — Ambulatory Visit (INDEPENDENT_AMBULATORY_CARE_PROVIDER_SITE_OTHER): Payer: BLUE CROSS/BLUE SHIELD | Admitting: Internal Medicine

## 2014-08-06 VITALS — BP 135/88 | HR 86 | Temp 98.2°F | Ht 71.0 in | Wt 244.0 lb

## 2014-08-06 DIAGNOSIS — M549 Dorsalgia, unspecified: Secondary | ICD-10-CM

## 2014-08-06 DIAGNOSIS — G8929 Other chronic pain: Secondary | ICD-10-CM

## 2014-08-06 DIAGNOSIS — G4730 Sleep apnea, unspecified: Secondary | ICD-10-CM

## 2014-08-06 DIAGNOSIS — I10 Essential (primary) hypertension: Secondary | ICD-10-CM

## 2014-08-06 DIAGNOSIS — D229 Melanocytic nevi, unspecified: Secondary | ICD-10-CM

## 2014-08-06 NOTE — Assessment & Plan Note (Signed)
Multiple nevus, states he saw Dr. Allyson Sabal few weeks ago and was prescribed observation

## 2014-08-06 NOTE — Patient Instructions (Signed)
Please go to the front and schedule a visit in 6 months  for a physical exam     Come back fasting    If  cough, take Mucinex DM twice a day as needed  OTC Nasocort or Flonase : 2 nasal sprays on each side of the nose daily until you feel better  Call if not gradually better over the next  2 weeks  Claritin OTC 10 mg one at bedtime as needed

## 2014-08-06 NOTE — Progress Notes (Signed)
Pre visit review using our clinic review tool, if applicable. No additional management support is needed unless otherwise documented below in the visit note. 

## 2014-08-06 NOTE — Assessment & Plan Note (Addendum)
Back pain pain, taking oxycodone sometimes, very rarely Flexeril (helped ?) He already joined the [facility], encouraged him to stay active

## 2014-08-06 NOTE — Assessment & Plan Note (Addendum)
Intolerance to CPAP, recommend to think about a mouth device, discuss with his dentist

## 2014-08-06 NOTE — Progress Notes (Signed)
Subjective:    Patient ID: Edward Wilkerson, male    DOB: 1951/01/04, 64 y.o.   MRN: [id number]  DOS:  08/06/2014 Type of visit - description : rov Interval history: Good compliance of medication, ambulatory BP satisfactory Back pain about the same, taking mostly oxycodone, not Flexeril. Not feeling well for 2 or 3 weeks: Cough with white yellowish sputum, + postnasal dripping, not sleeping well.   Review of Systems  Denies fever chills No headaches, sinus pain or congestion. Very little sneezing, no itchy eyes or nose. He joined [employer], more active lately.  Past Medical History  Diagnosis Date  . DM type 2 (diabetes mellitus, type 2)     bordeline  . Sleep apnea     could'nt tol CPAP (has a machine)   . Hypertension   . Hyperlipidemia   . Depression   . 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 RBBB (right bundle branch block with left anterior fascicular block) 2009    neg stress ECHO, saw cards  . Back pain     on-off even after surgery    Past Surgical History  Procedure Laterality Date  . Inguinal hernia repair  1977  . Appendectomy  1991  . Lumbar laminectomy  2001    Dr.Jenkins, still has mild pain  . Shoulder surgery  07/2008    (Right)    History   Social History  . Marital Status: Married    Spouse Name: N/A  . Number of Children: 2  . Years of Education: N/A   Occupational History  . 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 Smoking status: Never Smoker   . Smokeless tobacco: Former Systems developer    Quit date: 09/21/1991  . Alcohol Use: 0.0 oz/week     Comment: beer socially  . Drug Use: No  . Sexual Activity: Not on file   Other Topics Concern  . Not on file   Social History Narrative        Medication List       This list is accurate as of: 08/06/14  5:49 PM.  Always use your most recent med list.               atorvastatin 20 MG tablet  Commonly known as:  LIPITOR  TAKE 1 TABLET DAILY     BENICAR HCT 40-25 MG per  tablet  Generic drug:  olmesartan-hydrochlorothiazide  TAKE 1 TABLET DAILY     cyclobenzaprine 10 MG tablet  Commonly known as:  FLEXERIL  Take 1 tablet (10 mg total) by mouth 2 (two) times daily as needed for muscle spasms.     oxyCODONE-acetaminophen 5-325 MG per tablet  Commonly known as:  PERCOCET/ROXICET  Take 1 tablet by mouth every 4 (four) hours as needed. rx by another MD           Objective:   Physical Exam BP 135/88 mmHg  Pulse 86  Temp(Src) 98.2 F (36.8 C) (Oral)  Ht 5\' 11"  (1.803 m)  Wt 244 lb (110.678 kg)  BMI 34.05 kg/m2  SpO2 97% General:   Well developed, well nourished . NAD.  HEENT:  Normocephalic . Face symmetric, atraumatic. Sinuses no TTP TMs normal, nose slightly congested, throat symmetric Lungs:  CTA B Normal respiratory effort, no intercostal retractions, no accessory muscle use. Heart: RRR,  no murmur.  Muscle skeletal: no pretibial edema bilaterally  Skin: Not pale. Not jaundice Neurologic:  alert & oriented X3.  Speech normal, gait appropriate for age and unassisted Psych--  Cognition and judgment appear intact.  Cooperative with normal attention span and concentration.  Behavior appropriate. No anxious or depressed appearing.        Assessment & Plan:    URI, Upper respiratory symptoms, URI versus allergies, conservative treatment, see instructions. O2 sat initially 92%, recheck 97.

## 2014-08-06 NOTE — Assessment & Plan Note (Addendum)
Check his ambulatory BPs rarely, they are usually okay. Good compliance with medications, no change

## 2014-12-02 ENCOUNTER — Other Ambulatory Visit: Payer: Self-pay

## 2014-12-04 LAB — HM DIABETES EYE EXAM

## 2014-12-11 ENCOUNTER — Ambulatory Visit (INDEPENDENT_AMBULATORY_CARE_PROVIDER_SITE_OTHER): Payer: BLUE CROSS/BLUE SHIELD | Admitting: Internal Medicine

## 2014-12-11 ENCOUNTER — Encounter: Payer: Self-pay | Admitting: Internal Medicine

## 2014-12-11 VITALS — BP 122/76 | HR 98 | Temp 97.9°F | Ht 71.0 in | Wt 241.2 lb

## 2014-12-11 DIAGNOSIS — G629 Polyneuropathy, unspecified: Secondary | ICD-10-CM

## 2014-12-11 LAB — HM DIABETES FOOT EXAM

## 2014-12-11 MED ORDER — GABAPENTIN 300 MG PO CAPS: 300.0000 mg | ORAL_CAPSULE | Freq: Every day | ORAL | Status: DC

## 2014-12-11 NOTE — Progress Notes (Signed)
Subjective:    Patient ID: Edward Wilkerson, male    DOB: April 16, 1951, 64 y.o.   MRN: [id number]  DOS:  12/11/2014 Type of visit - description : Acute Interval history: 3-4 months history of a strange feeling, no pain, numbness?--located at the top of the right great toe. Initially on and off, no more steady. Additionally, he also has some paresthesias at the bottom of the distal right foot.   Review of Systems  Denies back pain, ankle pain, edema or any rash.  Past Medical History  Diagnosis Date  . DM type 2 (diabetes mellitus, type 2)     bordeline  . Sleep apnea     could'nt tol CPAP (has a machine)   . Hypertension   . Hyperlipidemia   . Depression   . 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 RBBB (right bundle branch block with left anterior fascicular block) 2009    neg stress ECHO, saw cards  . Back pain     on-off even after surgery    Past Surgical History  Procedure Laterality Date  . Inguinal hernia repair  1977  . Appendectomy  1991  . Lumbar laminectomy  2001    Dr.Jenkins, still has mild pain  . Shoulder surgery  07/2008    (Right)    History   Social History  . Marital Status: Married    Spouse Name: N/A  . Number of Children: 2  . Years of Education: N/A   Occupational History  . 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 Smoking status: Never Smoker   . Smokeless tobacco: Former Systems developer    Quit date: 09/21/1991  . Alcohol Use: 0.0 oz/week     Comment: beer socially  . Drug Use: No  . Sexual Activity: Not on file   Other Topics Concern  . Not on file   Social History Narrative        Medication List       This list is accurate as of: 12/11/14 11:39 AM.  Always use your most recent med list.               atorvastatin 20 MG tablet  Commonly known as:  LIPITOR  TAKE 1 TABLET DAILY     BENICAR HCT 40-25 MG per tablet  Generic drug:  olmesartan-hydrochlorothiazide  TAKE 1 TABLET DAILY     cyclobenzaprine 10 MG  tablet  Commonly known as:  FLEXERIL  Take 1 tablet (10 mg total) by mouth 2 (two) times daily as needed for muscle spasms.     oxyCODONE-acetaminophen 5-325 MG per tablet  Commonly known as:  PERCOCET/ROXICET  Take 1 tablet by mouth every 4 (four) hours as needed. rx by another MD           Objective:   Physical Exam BP 122/76 mmHg  Pulse 98  Temp(Src) 97.9 F (36.6 C) (Oral)  Ht 5\' 11"  (1.803 m)  Wt 241 lb 4 oz (109.43 kg)  BMI 33.66 kg/m2  SpO2 95% General:   Well developed, well nourished . NAD.  HEENT:  Normocephalic . Face symmetric, atraumatic Lower extremities: Normal pedal pulses, skin intact, pinprick examination: Able to feel everywhere but at the top of the right great toe the feeling is different. Skin: Not pale. Not jaundice Neurologic:  alert & oriented X3.  Speech normal, gait appropriate for age and unassisted DTRs symmetric Psych--  Cognition and judgment appear  intact.  Cooperative with normal attention span and concentration.  Behavior appropriate. No anxious or depressed appearing.        Assessment & Plan:   Neuropathy, Symptoms consistent with neuropathy, he does have  mild diabetes. We talk about doing further eval with labs and a NCS versus treat with gabapentin. Elected treatment. Star gabapenting at night, increase to twice a day if necessary.

## 2014-12-11 NOTE — Progress Notes (Signed)
Pre visit review using our clinic review tool, if applicable. No additional management support is needed unless otherwise documented below in the visit note. 

## 2014-12-11 NOTE — Patient Instructions (Signed)
Take gabapentin 300 mg one tablet every night If that is not helping enough, please let us know   Diabetes and Foot Care Diabetes may cause you to have problems because of poor blood supply (circulation) to your feet and legs. This may cause the skin on your feet to become thinner, break easier, and heal more slowly. Your skin may become dry, and the skin may peel and crack. You may also have nerve damage in your legs and feet causing decreased feeling in them. You may not notice minor injuries to your feet that could lead to infections or more serious problems. Taking care of your feet is one of the most important things you can do for yourself.  HOME CARE INSTRUCTIONS  Wear shoes at all times, even in the house. Do not go barefoot. Bare feet are easily injured.  Check your feet daily for blisters, cuts, and redness. If you cannot see the bottom of your feet, use a mirror or ask someone for help.  Wash your feet with warm water (do not use hot water) and mild soap. Then pat your feet and the areas between your toes until they are completely dry. Do not soak your feet as this can dry your skin.  Apply a moisturizing lotion or petroleum jelly (that does not contain alcohol and is unscented) to the skin on your feet and to dry, brittle toenails. Do not apply lotion between your toes.  Trim your toenails straight across. Do not dig under them or around the cuticle. File the edges of your nails with an emery board or nail file.  Do not cut corns or calluses or try to remove them with medicine.  Wear clean socks or stockings every day. Make sure they are not too tight. Do not wear knee-high stockings since they may decrease blood flow to your legs.  Wear shoes that fit properly and have enough cushioning. To break in new shoes, wear them for just a few hours a day. This prevents you from injuring your feet. Always look in your shoes before you put them on to be sure there are no objects  inside.  Do not cross your legs. This may decrease the blood flow to your feet.  If you find a minor scrape, cut, or break in the skin on your feet, keep it and the skin around it clean and dry. These areas may be cleansed with mild soap and water. Do not cleanse the area with peroxide, alcohol, or iodine.  When you remove an adhesive bandage, be sure not to damage the skin around it.  If you have a wound, look at it several times a day to make sure it is healing.  Do not use heating pads or hot water bottles. They may burn your skin. If you have lost feeling in your feet or legs, you may not know it is happening until it is too late.  Make sure your health care provider performs a complete foot exam at least annually or more often if you have foot problems. Report any cuts, sores, or bruises to your health care provider immediately. SEEK MEDICAL CARE IF:   You have an injury that is not healing.  You have cuts or breaks in the skin.  You have an ingrown nail.  You notice redness on your legs or feet.  You feel burning or tingling in your legs or feet.  You have pain or cramps in your legs and feet.  Your legs or  feet are numb.  Your feet always feel cold. SEEK IMMEDIATE MEDICAL CARE IF:   There is increasing redness, swelling, or pain in or around a wound.  There is a red line that goes up your leg.  Pus is coming from a wound.  You develop a fever or as directed by your health care provider.  You notice a bad smell coming from an ulcer or wound. Document Released: 05/21/2000 Document Revised: 01/24/2013 Document Reviewed: 10/31/2012 [facility] Patient Information 2015 [place], [place]. This information is not intended to replace advice given to you by your health care provider. Make sure you discuss any questions you have with your health care provider.

## 2015-02-24 ENCOUNTER — Telehealth: Payer: Self-pay | Admitting: Internal Medicine

## 2015-02-24 ENCOUNTER — Other Ambulatory Visit: Payer: Self-pay | Admitting: Internal Medicine

## 2015-02-24 MED ORDER — OLMESARTAN MEDOXOMIL-HCTZ 40-25 MG PO TABS: 1.0000 | ORAL_TABLET | Freq: Every day | ORAL | Status: DC

## 2015-02-24 MED ORDER — ATORVASTATIN CALCIUM 20 MG PO TABS: 20.0000 mg | ORAL_TABLET | Freq: Every day | ORAL | Status: DC

## 2015-02-24 NOTE — Telephone Encounter (Signed)
Pharmacy: [facility], [place]  Reason for call: pt needing atorvastatin and benicar. He has 2 weeks on hand. Please send to mail order 90 day supply with refills please. Has appt in December.

## 2015-02-24 NOTE — Telephone Encounter (Signed)
Rx's sent to [facility], #90 tablets and 0 refills until seen in December.

## 2015-05-13 ENCOUNTER — Encounter: Payer: Self-pay | Admitting: Internal Medicine

## 2015-05-13 ENCOUNTER — Ambulatory Visit (INDEPENDENT_AMBULATORY_CARE_PROVIDER_SITE_OTHER): Payer: BLUE CROSS/BLUE SHIELD | Admitting: Internal Medicine

## 2015-05-13 VITALS — BP 102/68 | HR 81 | Temp 98.1°F | Ht 71.0 in | Wt 241.2 lb

## 2015-05-13 DIAGNOSIS — Z23 Encounter for immunization: Secondary | ICD-10-CM | POA: Diagnosis not present

## 2015-05-13 DIAGNOSIS — Z0000 Encounter for general adult medical examination without abnormal findings: Secondary | ICD-10-CM | POA: Diagnosis not present

## 2015-05-13 DIAGNOSIS — E119 Type 2 diabetes mellitus without complications: Secondary | ICD-10-CM | POA: Diagnosis not present

## 2015-05-13 DIAGNOSIS — Z125 Encounter for screening for malignant neoplasm of prostate: Secondary | ICD-10-CM

## 2015-05-13 DIAGNOSIS — Z114 Encounter for screening for human immunodeficiency virus [HIV]: Secondary | ICD-10-CM

## 2015-05-13 DIAGNOSIS — Z1159 Encounter for screening for other viral diseases: Secondary | ICD-10-CM

## 2015-05-13 DIAGNOSIS — Z09 Encounter for follow-up examination after completed treatment for conditions other than malignant neoplasm: Secondary | ICD-10-CM

## 2015-05-13 LAB — CBC WITH DIFFERENTIAL/PLATELET
BASOS PCT: 0.3 % (ref 0.0–3.0)
Basophils Absolute: 0 10*3/uL (ref 0.0–0.1)
EOS ABS: 0.1 10*3/uL (ref 0.0–0.7)
Eosinophils Relative: 1.4 % (ref 0.0–5.0)
HCT: 48.3 % (ref 39.0–52.0)
HEMOGLOBIN: 16.3 g/dL (ref 13.0–17.0)
LYMPHS ABS: 1.5 10*3/uL (ref 0.7–4.0)
Lymphocytes Relative: 31.1 % (ref 12.0–46.0)
MCHC: 33.8 g/dL (ref 30.0–36.0)
MCV: 90.1 fl (ref 78.0–100.0)
MONO ABS: 0.4 10*3/uL (ref 0.1–1.0)
Monocytes Relative: 8.6 % (ref 3.0–12.0)
NEUTROS PCT: 58.6 % (ref 43.0–77.0)
Neutro Abs: 2.8 10*3/uL (ref 1.4–7.7)
PLATELETS: 101 10*3/uL — AB (ref 150.0–400.0)
RBC: 5.35 Mil/uL (ref 4.22–5.81)
RDW: 13.1 % (ref 11.5–15.5)
WBC: 4.8 10*3/uL (ref 4.0–10.5)

## 2015-05-13 LAB — TSH: TSH: 1.19 u[IU]/mL (ref 0.35–4.50)

## 2015-05-13 LAB — COMPREHENSIVE METABOLIC PANEL
ALT: 35 U/L (ref 0–53)
AST: 28 U/L (ref 0–37)
Albumin: 4.3 g/dL (ref 3.5–5.2)
Alkaline Phosphatase: 65 U/L (ref 39–117)
BUN: 16 mg/dL (ref 6–23)
CHLORIDE: 103 meq/L (ref 96–112)
CO2: 24 mEq/L (ref 19–32)
Calcium: 9.3 mg/dL (ref 8.4–10.5)
Creatinine, Ser: 0.94 mg/dL (ref 0.40–1.50)
GFR: 85.84 mL/min (ref >60.00)
GLUCOSE: 252 mg/dL — AB (ref 70–99)
POTASSIUM: 4.3 meq/L (ref 3.5–5.1)
SODIUM: 137 meq/L (ref 135–145)
Total Bilirubin: 0.7 mg/dL (ref 0.2–1.2)
Total Protein: 6.6 g/dL (ref 6.0–8.3)

## 2015-05-13 LAB — LIPID PANEL
CHOLESTEROL: 165 mg/dL (ref 0–200)
HDL: 38.2 mg/dL — AB (ref >39.00)
LDL CALC: 91 mg/dL (ref 0–99)
NONHDL: 126.79
Total CHOL/HDL Ratio: 4
Triglycerides: 180 mg/dL — ABNORMAL HIGH (ref 0.0–149.0)
VLDL: 36 mg/dL (ref 0.0–40.0)

## 2015-05-13 LAB — PSA: PSA: 1.07 ng/mL (ref 0.10–4.00)

## 2015-05-13 LAB — HEMOGLOBIN A1C: HEMOGLOBIN A1C: 9.3 % — AB (ref 4.6–6.5)

## 2015-05-13 MED ORDER — GABAPENTIN 300 MG PO CAPS
300.0000 mg | ORAL_CAPSULE | Freq: Every day | ORAL | Status: DC
Start: 2015-05-13 — End: 2015-08-05

## 2015-05-13 NOTE — Assessment & Plan Note (Signed)
DM: Check A1c, states is doing  better with exercise. HTN: Seems control. No change Hyperlipidemia: Continue Lipitor, labs. RTC 6 months

## 2015-05-13 NOTE — Progress Notes (Signed)
Pre visit review using our clinic review tool, if applicable. No additional management support is needed unless otherwise documented below in the visit note. 

## 2015-05-13 NOTE — Progress Notes (Signed)
Subjective:    Patient ID: Edward Wilkerson, male    DOB: 09-17-1950, 64 y.o.   MRN: [id number]  DOS:  05/13/2015 Type of visit - description : CPX Interval history: In general feeling well, he retired, has more time to be active   Review of Systems Constitutional: No fever. No chills. No unexplained wt changes. No unusual sweats  HEENT: No dental problems, no ear discharge, no facial swelling, no voice changes. No eye discharge, no eye  redness , no  intolerance to light   Respiratory: No wheezing , no  difficulty breathing. No cough , no mucus production  Cardiovascular: No CP, no leg swelling , no  Palpitations  GI: no nausea, no vomiting, no diarrhea , no  abdominal pain.  No blood in the stools. No dysphagia, no odynophagia    Endocrine: No polyphagia, no polyuria , no polydipsia  GU: No dysuria, gross hematuria, difficulty urinating. No urinary urgency, no frequency.  Musculoskeletal: No joint swellings or unusual aches or pains  Skin: No change in the color of the skin, palor , no  Rash  Allergic, immunologic: No environmental allergies , no  food allergies  Neurological: No dizziness no  syncope. No headaches. No diplopia, no slurred, no slurred speech, no motor deficits, no facial  Numbness  Hematological: No enlarged lymph nodes, no easy bruising , no unusual bleedings  Psychiatry: No suicidal ideas, no hallucinations, no beavior problems, no confusion.  No unusual/severe anxiety, no depression    Past Medical History  Diagnosis Date  . DM type 2 (diabetes mellitus, type 2) ([place])     bordeline  . Sleep apnea     could'nt tol CPAP (has a machine)   . Hypertension   . Hyperlipidemia   . Depression   . 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 RBBB (right bundle branch block with left anterior fascicular block) 2009    neg stress ECHO, saw cards  . Back pain     on-off even after surgery    Past Surgical History  Procedure Laterality Date  . Inguinal  hernia repair  1977  . Appendectomy  1991  . Lumbar laminectomy  2001    Dr.Jenkins, still has mild pain  . Shoulder surgery  07/2008    (Right)    Social History   Social History  . Marital Status: Married    Spouse Name: N/A  . Number of Children: 2  . Years of Education: N/A   Occupational History  . retired ---Sports administrator   Social History Main Topics  . Smoking status: Never Smoker   . Smokeless tobacco: Former Systems developer    Quit date: 09/21/1991  . Alcohol Use: 0.0 oz/week     Comment: beer socially  . Drug Use: No  . Sexual Activity: Not on file   Other Topics Concern  . Not on file   Social History Narrative   Retired 2016, more time to exercise      Family History  Problem Relation Age of Onset  . Coronary artery disease Father   . Coronary artery disease Brother   . Heart attack Father 28    deceased  . Hypertension Mother   . Diabetes Mother   . Diabetes Sister   . Stroke Sister     deceased   . Breast cancer Sister   . Cirrhosis Sister     no ETOH  . Colon cancer Neg Hx   .  Prostate cancer Neg Hx   . Colon polyps Other     many fam members        Medication List       This list is accurate as of: 05/13/15  6:02 PM.  Always use your most recent med list.               aspirin EC 81 MG tablet  Take 81 mg by mouth daily.     atorvastatin 20 MG tablet  Commonly known as:  LIPITOR  Take 1 tablet (20 mg total) by mouth daily.     cyclobenzaprine 10 MG tablet  Commonly known as:  FLEXERIL  Take 1 tablet (10 mg total) by mouth 2 (two) times daily as needed for muscle spasms.     gabapentin 300 MG capsule  Commonly known as:  NEURONTIN  Take 1 capsule (300 mg total) by mouth at bedtime.     olmesartan-hydrochlorothiazide 40-25 MG tablet  Commonly known as:  BENICAR HCT  Take 1 tablet by mouth daily.     oxyCODONE-acetaminophen 5-325 MG tablet  Commonly known as:  PERCOCET/ROXICET  Take 1 tablet by mouth every 4 (four)  hours as needed. rx by another MD           Objective:   Physical Exam BP 102/68 mmHg  Pulse 81  Temp(Src) 98.1 F (36.7 C) (Oral)  Ht 5\' 11"  (1.803 m)  Wt 241 lb 4 oz (109.43 kg)  BMI 33.66 kg/m2  SpO2 98% General:   Well developed, well nourished . NAD.  HEENT:  Normocephalic . Face symmetric, atraumatic Lungs:  CTA B Normal respiratory effort, no intercostal retractions, no accessory muscle use. Heart: RRR,  no murmur.  no pretibial edema bilaterally  Abdomen:  Not distended, soft, non-tender. No rebound or rigidity.  Rectal:  External skin tags notede. Normal sphincter tone. No rectal masses or tenderness.  No stools Prostate: Prostate gland firm and smooth, no enlargement, nodularity, tenderness, mass, asymmetry or induration.  Skin: Not pale. Not jaundice Neurologic:  alert & oriented X3.  Speech normal, gait appropriate for age and unassisted Psych--  Cognition and judgment appear intact.  Cooperative with normal attention span and concentration.  Behavior appropriate. No anxious or depressed appearing.    Assessment & Plan:   Assessment >  DM HTN Hyperlipidemia Depression carotid artery dz: 11-2013  [place] 0- 39% B, next 11-2015  MSK:  ---Cervical spinal stenosis, back pain, sees Dr Nelva Bush  ---On percodet per Dr Nelva Bush  ---R big toe paresthesia, helped w/ gabapentin (started ~ 12-2014) Splenomegaly DX 2003 Mild ITP OSA, CPAP intolerant RBBB 2009, (-)  stress echo, saw cardiology +FH CAD, father at age 56, F was a heavy smoker  PLAN DM: Check A1c, states is doing  better with exercise. HTN: Seems control. No change Hyperlipidemia: Continue Lipitor, labs. RTC 6 months

## 2015-05-13 NOTE — Assessment & Plan Note (Addendum)
Td 2015 Prevnar, PNM 23 -- declined for now  zostavax discussed  Before Flu shot  today +FH CAD-DM-stroke-colon polyps  S/p several Cscope  per pt  Last was ~ 08-2013 @ [facility], next 2020: asked to bring records Prostate cancer screening, check a PSA, DRE today normal  Labs: Hepatitis C, HIV, CMP, CBC, TSH, A1c PSA counseled about diet-exercise   Starting aspirin today

## 2015-05-13 NOTE — Patient Instructions (Signed)
Get your blood work before you leave    Start aspirin 81 mg one tablet daily.  Check the  blood pressure 2 or 3 times a month  Be sure your blood pressure is between 110/65 and  145/85.  if it is consistently higher or lower, let me know    From desk: Please get a release of information, send to the patient's gastroenterologist, need reports of colonoscopies   Next visit  for a    routine checkup in 6 months, nonfasting (15 minutes) Please schedule an appointment at the front desk

## 2015-05-14 ENCOUNTER — Encounter: Payer: Self-pay | Admitting: Internal Medicine

## 2015-05-14 DIAGNOSIS — E119 Type 2 diabetes mellitus without complications: Secondary | ICD-10-CM

## 2015-05-14 LAB — HEPATITIS C ANTIBODY: HCV Ab: NEGATIVE

## 2015-05-14 LAB — HIV ANTIBODY (ROUTINE TESTING W REFLEX): HIV 1&2 Ab, 4th Generation: NONREACTIVE

## 2015-05-14 MED ORDER — METFORMIN HCL 850 MG PO TABS
850.0000 mg | ORAL_TABLET | Freq: Two times a day (BID) | ORAL | Status: DC
Start: 2015-05-14 — End: 2015-07-21

## 2015-05-14 NOTE — Addendum Note (Signed)
Addended by: Wilfrid Lund on: 05/14/2015 04:17 PM   Modules accepted: Orders

## 2015-05-15 NOTE — Telephone Encounter (Signed)
Nutrition referral placed.

## 2015-05-21 ENCOUNTER — Telehealth: Payer: Self-pay | Admitting: *Deleted

## 2015-05-21 NOTE — Telephone Encounter (Signed)
Received Medical Records for pt from [facility], forwarded to PCP/SLS 12/14

## 2015-05-26 ENCOUNTER — Encounter: Payer: Self-pay | Admitting: Internal Medicine

## 2015-05-26 ENCOUNTER — Telehealth: Payer: Self-pay | Admitting: Internal Medicine

## 2015-05-26 NOTE — Telephone Encounter (Signed)
Caller name: Self  Can be reached: [phone]  Pharmacy:  [facility] [address] - [place], [place] [phone] (Phone) [phone] (Fax)         Reason for call: Patient concerned as to if his frequent drinking Gator Ade could be causing his BS to be elevated. Also needs rx for test strips. States he can get a free meter but needs rx for test strips

## 2015-05-26 NOTE — Telephone Encounter (Signed)
Need to know which meter he is using to be able to send strips.

## 2015-05-26 NOTE — Telephone Encounter (Signed)
AccuChek Meter

## 2015-06-17 ENCOUNTER — Ambulatory Visit: Payer: BLUE CROSS/BLUE SHIELD

## 2015-06-17 ENCOUNTER — Encounter: Payer: Self-pay | Admitting: Internal Medicine

## 2015-06-24 ENCOUNTER — Ambulatory Visit: Payer: BLUE CROSS/BLUE SHIELD

## 2015-07-01 ENCOUNTER — Ambulatory Visit: Payer: BLUE CROSS/BLUE SHIELD

## 2015-07-08 ENCOUNTER — Encounter: Payer: BLUE CROSS/BLUE SHIELD | Attending: Internal Medicine

## 2015-07-08 VITALS — Ht 72.0 in | Wt 238.0 lb

## 2015-07-08 DIAGNOSIS — E119 Type 2 diabetes mellitus without complications: Secondary | ICD-10-CM | POA: Diagnosis not present

## 2015-07-11 ENCOUNTER — Telehealth: Payer: Self-pay | Admitting: Internal Medicine

## 2015-07-11 MED ORDER — ACCU-CHEK SOFT TOUCH LANCETS MISC: Status: AC

## 2015-07-11 MED ORDER — GLUCOSE BLOOD VI STRP
ORAL_STRIP | Status: AC
Start: 2015-07-11 — End: ?

## 2015-07-11 MED ORDER — ACCU-CHEK AVIVA CONNECT W/DEVICE KIT: 1.0000 | PACK | Freq: Once | Status: AC

## 2015-07-11 NOTE — Telephone Encounter (Signed)
Please let Pt know that I have sent the [place], testing strips and lancets to [place] for him. Thank you.

## 2015-07-11 NOTE — Telephone Encounter (Signed)
Pt went to nutritionist and was advised to check blood sugar. Pt got RX for free meter but pharmacy states he needs RX for meter and strips (coupon for [employer]). Pt asking for call back to let him know what to do next to get this from pharmacy? Cell # [phone].

## 2015-07-11 NOTE — Telephone Encounter (Signed)
Left msg to notify pt °

## 2015-07-11 NOTE — Progress Notes (Signed)
Patient was seen on 07/08/15 for the first of a series of three diabetes self-management courses at [facility].  Patient Education Plan per assessed needs and concerns is to attend four course education program for Diabetes Self Management Education.  The following learning objectives were met by the patient during this class:  Describe diabetes  State some common risk factors for diabetes  Defines the role of glucose and insulin  Identifies type of diabetes and pathophysiology  Describe the relationship between diabetes and cardiovascular risk  State the members of the Healthcare Team  States the rationale for glucose monitoring  State when to test glucose  State their individual Target Range  State the importance of logging glucose readings  Describe how to interpret glucose readings  Identifies A1C target  Explain the correlation between A1c and eAG values  State symptoms and treatment of high blood glucose  State symptoms and treatment of low blood glucose  Explain proper technique for glucose testing  Identifies proper sharps disposal  Handouts given during class include:  Living Well with Diabetes book  Carb Counting and Meal Planning book  Meal Plan Card  Carbohydrate guide  Meal planning worksheet  Low Sodium Flavoring Tips  The diabetes portion plate  V9D to eAG Conversion Chart  Diabetes Medications  Diabetes Recommended Care Schedule  Support Group  Diabetes Success Plan  Core Class Satisfaction Survey  Follow-Up Plan:  Attend core 2

## 2015-07-15 ENCOUNTER — Encounter: Payer: BLUE CROSS/BLUE SHIELD | Attending: Internal Medicine

## 2015-07-15 DIAGNOSIS — E119 Type 2 diabetes mellitus without complications: Secondary | ICD-10-CM | POA: Diagnosis present

## 2015-07-15 NOTE — Progress Notes (Signed)

## 2015-07-21 ENCOUNTER — Ambulatory Visit (INDEPENDENT_AMBULATORY_CARE_PROVIDER_SITE_OTHER): Payer: BLUE CROSS/BLUE SHIELD | Admitting: Internal Medicine

## 2015-07-21 ENCOUNTER — Encounter: Payer: Self-pay | Admitting: Internal Medicine

## 2015-07-21 VITALS — BP 116/72 | HR 93 | Temp 98.2°F | Ht 72.0 in | Wt 235.4 lb

## 2015-07-21 DIAGNOSIS — Z09 Encounter for follow-up examination after completed treatment for conditions other than malignant neoplasm: Secondary | ICD-10-CM

## 2015-07-21 DIAGNOSIS — E1165 Type 2 diabetes mellitus with hyperglycemia: Secondary | ICD-10-CM | POA: Diagnosis not present

## 2015-07-21 LAB — HEMOGLOBIN A1C: HEMOGLOBIN A1C: 7.7 % — AB (ref 4.6–6.5)

## 2015-07-21 MED ORDER — ATORVASTATIN CALCIUM 20 MG PO TABS: 20.0000 mg | ORAL_TABLET | Freq: Every day | ORAL | Status: DC

## 2015-07-21 MED ORDER — OLMESARTAN MEDOXOMIL-HCTZ 40-25 MG PO TABS: 1.0000 | ORAL_TABLET | Freq: Every day | ORAL | Status: DC

## 2015-07-21 NOTE — Assessment & Plan Note (Signed)
DM: A1c went from 6.0 to 9.3 a couple of months ago , he decided not to take metformin rather went to see a nutritionist, is eating healthier has improved his exercise habits. We had a long discussion about pros and cons of medication, encouraged him to agree to take medication if needed. Plan: A1c  RTC 4 months 1 care declined prevnar

## 2015-07-21 NOTE — Progress Notes (Signed)
Subjective:    Patient ID: Edward Wilkerson, male    DOB: [birth year], 65 y.o.   MRN: [id number]  DOS:  07/21/2015 Type of visit - description : Follow-up Interval history: Last A1c was elevated, since then he decided not to take metformin, has changed his diet, is exercising more. Likes to check his A1c. On looking back, prior to the last a1c, he was drinking a lot of sugary fluids.  Wt Readings from Last 3 Encounters:  07/21/15 235 lb 6 oz (106.765 kg)  07/11/15 238 lb (107.956 kg)  05/13/15 241 lb 4 oz (109.43 kg)     Review of Systems No chest pain or difficulty breathing No visual disturbances No polyuria or polydipsia  Past Medical History  Diagnosis Date  . DM type 2 (diabetes mellitus, type 2) ([place])     bordeline  . Sleep apnea     could'nt tol CPAP (has a machine)   . Hypertension   . Hyperlipidemia   . Depression   . 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 RBBB (right bundle branch block with left anterior fascicular block) 2009    neg stress ECHO, saw cards  . Back pain     on-off even after surgery    Past Surgical History  Procedure Laterality Date  . Inguinal hernia repair  1977  . Appendectomy  1991  . Lumbar laminectomy  2001    Dr.Jenkins, still has mild pain  . Shoulder surgery  07/2008    (Right)    Social History   Social History  . Marital Status: Married    Spouse Name: N/A  . Number of Children: 2  . Years of Education: N/A   Occupational History  . retired ---Sports administrator   Social History Main Topics  . Smoking status: Never Smoker   . Smokeless tobacco: Former Systems developer    Quit date: 09/21/1991  . Alcohol Use: 0.0 oz/week     Comment: beer socially  . Drug Use: No  . Sexual Activity: Not on file   Other Topics Concern  . Not on file   Social History Narrative   Retired 2016, more time to exercise         Medication List       This list is accurate as of: 07/21/15  2:11 PM.  Always use your most recent med  list.               ACCU-CHEK AVIVA CONNECT w/Device Kit  1 Device by Does not apply route once. Check blood sugar no more than two times daily.     accu-chek soft touch lancets  Check blood sugar no more than twice daily     aspirin EC 81 MG tablet  Take 81 mg by mouth daily.     atorvastatin 20 MG tablet  Commonly known as:  LIPITOR  Take 1 tablet (20 mg total) by mouth daily.     cyclobenzaprine 10 MG tablet  Commonly known as:  FLEXERIL  Take 1 tablet (10 mg total) by mouth 2 (two) times daily as needed for muscle spasms.     gabapentin 300 MG capsule  Commonly known as:  NEURONTIN  Take 1 capsule (300 mg total) by mouth at bedtime.     glucose blood test strip  Commonly known as:  ACCU-CHEK AVIVA  Check blood sugar no more than twice daily.     olmesartan-hydrochlorothiazide 40-25 MG tablet  Commonly known as:  BENICAR HCT  Take 1 tablet by mouth daily.     oxyCODONE-acetaminophen 5-325 MG tablet  Commonly known as:  PERCOCET/ROXICET  Take 1 tablet by mouth every 4 (four) hours as needed. rx by another MD           Objective:   Physical Exam BP 116/72 mmHg  Pulse 93  Temp(Src) 98.2 F (36.8 C) (Oral)  Ht 6' (1.829 m)  Wt 235 lb 6 oz (106.765 kg)  BMI 31.92 kg/m2  SpO2 96% General:   Well developed, well nourished . NAD.  HEENT:  Normocephalic . Face symmetric, atraumatic Lungs:  CTA B Normal respiratory effort, no intercostal retractions, no accessory muscle use. Heart: RRR,  no murmur.  No pretibial edema bilaterally  Skin: Not pale. Not jaundice Neurologic:  alert & oriented X3.  Speech normal, gait appropriate for age and unassisted Psych--  Cognition and judgment appear intact.  Cooperative with normal attention span and concentration.  Behavior appropriate. No anxious or depressed appearing.      Assessment & Plan:   Assessment >  DM HTN Hyperlipidemia Depression carotid artery dz: 11-2013  [place] 0- 39% B, next 11-2015  MSK:    ---Cervical spinal stenosis, back pain, sees Dr Nelva Bush  ---On percodet per Dr Nelva Bush  ---R big toe paresthesia, helped w/ gabapentin (started ~ 12-2014) Splenomegaly DX 2003 Mild ITP OSA, CPAP intolerant RBBB 2009, (-)  stress echo, saw cardiology +FH CAD, father at age 58, F was a heavy smoker  PLAN  DM: A1c went from 6.0 to 9.3 a couple of months ago , he decided not to take metformin rather went to see a nutritionist, is eating healthier has improved his exercise habits. We had a long discussion about pros and cons of medication, encouraged him to agree to take medication if needed. Plan: A1c  RTC 4 months 1 care declined prevnar

## 2015-07-21 NOTE — Patient Instructions (Signed)
GO TO THE LAB : Get the blood work    GO TO THE FRONT DESK  Schedule a routine office visit or check up to be done in 4 months   Please be fasting   Front desk:  15   Diabetes: Check your blood sugar  once a day     Check your blood sugar  at different times of the day  GOALS: Fasting before a meal 70- 130 2 hours after a meal less than 180 At bedtime 90-150 Call if consistently not at goal

## 2015-07-21 NOTE — Progress Notes (Signed)
Pre visit review using our clinic review tool, if applicable. No additional management support is needed unless otherwise documented below in the visit note. 

## 2015-07-22 ENCOUNTER — Ambulatory Visit: Payer: BLUE CROSS/BLUE SHIELD | Admitting: Internal Medicine

## 2015-07-22 DIAGNOSIS — E119 Type 2 diabetes mellitus without complications: Secondary | ICD-10-CM | POA: Diagnosis not present

## 2015-07-22 NOTE — Progress Notes (Signed)
Patient was seen on 07/22/15 for the third of a series of three diabetes self-management courses at the [facility].   Edward Wilkerson the amount of activity recommended for healthy living . Describe activities suitable for individual needs . Identify ways to regularly incorporate activity into daily life . Identify barriers to activity and ways to over come these barriers  Identify diabetes medications being personally used and their primary action for lowering glucose and possible side effects . Describe role of stress on blood glucose and develop strategies to address psychosocial issues . Identify diabetes complications and ways to prevent them  Explain how to manage diabetes during illness . Evaluate success in meeting personal goal . Establish 2-3 goals that they will plan to diligently work on until they return for the  17-month follow-up visit  Goals:   I will count my carb choices at most meals and snacks  I will be active 30 minutes or more 5 times a week  I will test my glucose at least 1 times a day, 7 days a week   Your patient has identified these potential barriers to change:  Motivation  Your patient has identified their diabetes self-care support plan as  None noted Plan:  Attend Optional Core 4 in 4 months

## 2015-08-05 ENCOUNTER — Telehealth: Payer: Self-pay | Admitting: Internal Medicine

## 2015-08-05 MED ORDER — ATORVASTATIN CALCIUM 20 MG PO TABS: 20.0000 mg | ORAL_TABLET | Freq: Every day | ORAL | Status: DC

## 2015-08-05 MED ORDER — GABAPENTIN 300 MG PO CAPS: 300.0000 mg | ORAL_CAPSULE | Freq: Every day | ORAL | Status: DC

## 2015-08-05 MED ORDER — OLMESARTAN MEDOXOMIL-HCTZ 40-25 MG PO TABS: 1.0000 | ORAL_TABLET | Freq: Every day | ORAL | Status: DC

## 2015-08-05 NOTE — Telephone Encounter (Signed)
Rx's resent.  

## 2015-08-05 NOTE — Telephone Encounter (Signed)
Caller name: Cori Razor with [facility] Can be reached: [phone] Fax: [phone]  Reason for call: [facility] called to confirm RXs sent 07/21/15. We had incorrect pharmacy selected. I updated pharmacy to send to with correct phone and fax #s. Please refax today if possible.

## 2015-09-16 ENCOUNTER — Encounter: Payer: Self-pay | Admitting: Physical Medicine and Rehabilitation

## 2015-10-27 ENCOUNTER — Ambulatory Visit (INDEPENDENT_AMBULATORY_CARE_PROVIDER_SITE_OTHER): Payer: BLUE CROSS/BLUE SHIELD | Admitting: Internal Medicine

## 2015-10-27 ENCOUNTER — Encounter: Payer: Self-pay | Admitting: Internal Medicine

## 2015-10-27 ENCOUNTER — Other Ambulatory Visit: Payer: Self-pay | Admitting: Internal Medicine

## 2015-10-27 VITALS — BP 122/76 | HR 75 | Temp 98.0°F | Ht 72.0 in | Wt 236.0 lb

## 2015-10-27 DIAGNOSIS — I10 Essential (primary) hypertension: Secondary | ICD-10-CM

## 2015-10-27 DIAGNOSIS — I6523 Occlusion and stenosis of bilateral carotid arteries: Secondary | ICD-10-CM

## 2015-10-27 DIAGNOSIS — E1165 Type 2 diabetes mellitus with hyperglycemia: Secondary | ICD-10-CM

## 2015-10-27 DIAGNOSIS — Z09 Encounter for follow-up examination after completed treatment for conditions other than malignant neoplasm: Secondary | ICD-10-CM

## 2015-10-27 LAB — BASIC METABOLIC PANEL
BUN: 18 mg/dL (ref 6–23)
CO2: 26 mEq/L (ref 19–32)
CREATININE: 0.84 mg/dL (ref 0.40–1.50)
Calcium: 9.6 mg/dL (ref 8.4–10.5)
Chloride: 106 mEq/L (ref 96–112)
GFR: 97.59 mL/min (ref >60.00)
Glucose, Bld: 135 mg/dL — ABNORMAL HIGH (ref 70–99)
Potassium: 4.2 mEq/L (ref 3.5–5.1)
Sodium: 138 mEq/L (ref 135–145)

## 2015-10-27 LAB — HEMOGLOBIN A1C: HEMOGLOBIN A1C: 6.3 % (ref 4.6–6.5)

## 2015-10-27 NOTE — Progress Notes (Signed)
Pre visit review using our clinic review tool, if applicable. No additional management support is needed unless otherwise documented below in the visit note. 

## 2015-10-27 NOTE — Patient Instructions (Signed)
GO TO THE LAB : Get the blood work     GO TO THE FRONT DESK Schedule your next appointment for a  Check up in 4-5 months   

## 2015-10-27 NOTE — Progress Notes (Signed)
Subjective:    Patient ID: Edward Wilkerson, male    DOB: 1950/12/08, 65 y.o.   MRN: [id number]  DOS:  10/27/2015 Type of visit - description : Routine visit Interval history: Since the last visit, he is doing better with diet, saw a nutritionist. CBGs are mostly within normal, but occasionally they are still high, up to 200. Also more active, playing more golf. Hypertension: Good compliance of medication, ambulatory BPs within normal.  Wt Readings from Last 3 Encounters:  10/27/15 236 lb (107.049 kg)  07/21/15 235 lb 6 oz (106.765 kg)  07/11/15 238 lb (107.956 kg)     Review of Systems   Past Medical History  Diagnosis Date  . DM type 2 (diabetes mellitus, type 2) ([place])     bordeline  . Sleep apnea     could'nt tol CPAP (has a machine)   . Hypertension   . Hyperlipidemia   . Depression   . 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 RBBB (right bundle branch block with left anterior fascicular block) 2009    neg stress ECHO, saw cards  . Back pain     on-off even after surgery    Past Surgical History  Procedure Laterality Date  . Inguinal hernia repair  1977  . Appendectomy  1991  . Lumbar laminectomy  2001    Dr.Jenkins, still has mild pain  . Shoulder surgery  07/2008    (Right)    Social History   Social History  . Marital Status: Married    Spouse Name: N/A  . Number of Children: 2  . Years of Education: N/A   Occupational History  . retired ---Sports administrator   Social History Main Topics  . Smoking status: Never Smoker   . Smokeless tobacco: Former Systems developer    Quit date: 09/21/1991  . Alcohol Use: 0.0 oz/week     Comment: beer socially  . Drug Use: No  . Sexual Activity: Not on file   Other Topics Concern  . Not on file   Social History Narrative   Retired 2016, more time to exercise         Medication List       This list is accurate as of: 10/27/15 12:56 PM.  Always use your most recent med list.               ACCU-CHEK  AVIVA CONNECT w/Device Kit  1 Device by Does not apply route once. Check blood sugar no more than two times daily.     accu-chek soft touch lancets  Check blood sugar no more than twice daily     aspirin EC 81 MG tablet  Take 81 mg by mouth daily.     atorvastatin 20 MG tablet  Commonly known as:  LIPITOR  Take 1 tablet (20 mg total) by mouth daily.     cyclobenzaprine 10 MG tablet  Commonly known as:  FLEXERIL  Take 1 tablet (10 mg total) by mouth 2 (two) times daily as needed for muscle spasms.     gabapentin 300 MG capsule  Commonly known as:  NEURONTIN  Take 1 capsule (300 mg total) by mouth at bedtime.     glucose blood test strip  Commonly known as:  ACCU-CHEK AVIVA  Check blood sugar no more than twice daily.     olmesartan-hydrochlorothiazide 40-25 MG tablet  Commonly known as:  BENICAR HCT  Take 1 tablet by mouth  daily.     oxyCODONE-acetaminophen 5-325 MG tablet  Commonly known as:  PERCOCET/ROXICET  Take 1 tablet by mouth every 4 (four) hours as needed. Reported on 10/27/2015           Objective:   Physical Exam BP 122/76 mmHg  Pulse 75  Temp(Src) 98 F (36.7 C) (Oral)  Ht 6' (1.829 m)  Wt 236 lb (107.049 kg)  BMI 32.00 kg/m2  SpO2 95% General:   Well developed, well nourished . NAD.  HEENT:  Normocephalic . Face symmetric, atraumatic Lungs:  CTA B Normal respiratory effort, no intercostal retractions, no accessory muscle use. Heart: RRR,  no murmur.  No pretibial edema bilaterally  Skin: Not pale. Not jaundice Neurologic:  alert & oriented X3.  Speech normal, gait appropriate for age and unassisted Psych--  Cognition and judgment appear intact.  Cooperative with normal attention span and concentration.  Behavior appropriate. No anxious or depressed appearing.      Assessment & Plan:   Assessment >  DM HTN Hyperlipidemia Depression carotid artery dz: 11-2013  [place] 0- 39% B, next 11-2015  MSK:  ---Cervical spinal stenosis, back pain,  sees Dr Nelva Bush  ---On percodet per Dr Nelva Bush  ---R big toe paresthesia, helped w/ gabapentin (started ~ 12-2014) HEM: --Splenomegaly DX 2003 --Mild ITP OSA, CPAP intolerant RBBB 2009, (-)  stress echo, saw cardiology +FH CAD, father at age 62, F was a heavy smoker Moving to [place] 10-2015, will continue coming to this clinic for now.  PLAN  DM: Changed his diet, more active, on no medicines, will check A1c. Advise patient not to feel frustrated if the A1c is not at goal, may require medication HTN: Continue Benicar, check a BMP. RTC 4 -5 months

## 2015-10-27 NOTE — Assessment & Plan Note (Signed)
DM: Changed his diet, more active, on no medicines, will check A1c. Advise patient not to feel frustrated if the A1c is not at goal, may require medication HTN: Continue Benicar, check a BMP. RTC 4 -5 months

## 2015-10-30 ENCOUNTER — Inpatient Hospital Stay (HOSPITAL_COMMUNITY): Admission: RE | Admit: 2015-10-30 | Payer: BLUE CROSS/BLUE SHIELD | Source: Ambulatory Visit

## 2015-11-11 ENCOUNTER — Ambulatory Visit: Payer: BLUE CROSS/BLUE SHIELD | Admitting: Internal Medicine

## 2016-01-26 ENCOUNTER — Ambulatory Visit (INDEPENDENT_AMBULATORY_CARE_PROVIDER_SITE_OTHER): Payer: BLUE CROSS/BLUE SHIELD | Admitting: Internal Medicine

## 2016-01-26 ENCOUNTER — Encounter: Payer: Self-pay | Admitting: Internal Medicine

## 2016-01-26 VITALS — BP 122/66 | HR 80 | Temp 97.6°F | Resp 14 | Ht 72.0 in | Wt 233.2 lb

## 2016-01-26 DIAGNOSIS — M779 Enthesopathy, unspecified: Secondary | ICD-10-CM | POA: Diagnosis not present

## 2016-01-26 DIAGNOSIS — I739 Peripheral vascular disease, unspecified: Secondary | ICD-10-CM

## 2016-01-26 DIAGNOSIS — I779 Disorder of arteries and arterioles, unspecified: Secondary | ICD-10-CM | POA: Diagnosis not present

## 2016-01-26 MED ORDER — DICLOFENAC SODIUM 1 % TD GEL: 2.0000 g | Freq: Four times a day (QID) | TRANSDERMAL | 2 refills | Status: DC

## 2016-01-26 NOTE — Progress Notes (Signed)
Pre visit review using our clinic review tool, if applicable. No additional management support is needed unless otherwise documented below in the visit note. 

## 2016-01-26 NOTE — Progress Notes (Signed)
Subjective:    Patient ID: Edward Wilkerson, male    DOB: 06-22-1950, 65 y.o.   MRN: [id number]  DOS:  01/26/2016 Type of visit - description : Acute visit Interval history: Discomfort at the base of the medial finger for about 8 months, started on the left hand now also on the right. Denies any swelling, redness or injury. Symptoms worse when he plays golf.. Occ trigger phenomena. Was recommended a topical NSAIDs by another  practitioner which seems to help a little. Due for a carotid ultrasound.   Review of Systems  No chest pain or difficulty breathing He remains very active.  Past Medical History:  Diagnosis Date  . Back pain    on-off even after surgery  . Depression   . DM type 2 (diabetes mellitus, type 2) ([place])    bordeline  . Hyperlipidemia   . Hypertension   . RBBB (right bundle branch block with left anterior fascicular block) 2009   neg stress ECHO, saw cards  . Sleep apnea    could'nt tol CPAP (has a machine)   . 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 APPENDECTOMY  1991  . [place]  . LUMBAR LAMINECTOMY  2001   Dr.Jenkins, still has mild pain  . SHOULDER SURGERY  07/2008   (Right)    Social History   Social History  . Marital status: Married    Spouse name: N/A  . Number of children: 2  . Years of education: N/A   Occupational History  . retired ---Sports administrator   Social History Main Topics  . Smoking status: Never Smoker  . Smokeless tobacco: Former Systems developer    Quit date: 09/21/1991  . Alcohol use 0.0 oz/week     Comment: beer socially  . Drug use: No  . Sexual activity: Not on file   Other Topics Concern  . Not on file   Social History Narrative   Retired 2016, more time to exercise         Medication List       Accurate as of 01/26/16  4:59 PM. Always use your most recent med list.          ACCU-CHEK AVIVA CONNECT w/Device Kit 1 Device by  Does not apply route once. Check blood sugar no more than two times daily.   accu-chek soft touch lancets Check blood sugar no more than twice daily   aspirin EC 81 MG tablet Take 81 mg by mouth daily.   atorvastatin 20 MG tablet Commonly known as:  LIPITOR Take 1 tablet (20 mg total) by mouth daily.   cyclobenzaprine 10 MG tablet Commonly known as:  FLEXERIL Take 1 tablet (10 mg total) by mouth 2 (two) times daily as needed for muscle spasms.   diclofenac sodium 1 % Gel Commonly known as:  VOLTAREN Apply 2 g topically 4 (four) times daily.   gabapentin 300 MG capsule Commonly known as:  NEURONTIN Take 1 capsule (300 mg total) by mouth at bedtime.   glucose blood test strip Commonly known as:  ACCU-CHEK AVIVA Check blood sugar no more than twice daily.   olmesartan-hydrochlorothiazide 40-25 MG tablet Commonly known as:  BENICAR HCT Take 1 tablet by mouth daily.   oxyCODONE-acetaminophen 5-325 MG tablet Commonly known as:  PERCOCET/ROXICET Take 1 tablet by mouth every 4 (four) hours as needed. Reported on 10/27/2015  Objective:   Physical Exam BP 122/66 (BP Location: Left Arm, Patient Position: Sitting, Cuff Size: Normal)   Pulse 80   Temp 97.6 F (36.4 C) (Oral)   Resp 14   Ht 6' (1.829 m)   Wt 233 lb 4 oz (105.8 kg)   SpO2 94%   BMI 31.63 kg/m  General:   Well developed, well nourished . NAD.  HEENT:  Normocephalic . Face symmetric, atraumatic Hands: Normal to inspection and palpation without synovitis except for mild tenderness just proximal from the left middle finger, palmar aspect. No trigger phenomena observed today Skin: Not pale. Not jaundice Neurologic:  alert & oriented X3.  Speech normal, gait appropriate for age and unassisted Psych--  Cognition and judgment appear intact.  Cooperative with normal attention span and concentration.  Behavior appropriate. No anxious or depressed appearing.      Assessment & Plan:   Assessment >    DM HTN Hyperlipidemia Depression carotid artery dz: 11-2013  [place] 0- 39% B, next 11-2015  MSK:  ---Cervical spinal stenosis, back pain, sees Dr Nelva Bush  ---On percodet per Dr Nelva Bush  ---R big toe paresthesia, helped w/ gabapentin (started ~ 12-2014) HEM: --Splenomegaly DX 2003 --Mild ITP OSA, CPAP intolerant RBBB 2009, (-)  stress echo, saw cardiology +FH CAD, father at age 52, F was a heavy smoker Moving to [place] 10-2015, will continue coming to this clinic for now.  PLAN  Hand tendinitis, trigger phenomena: Rx Options include observation versus continue topical therapy with NSAIDs versus referral. Elected topical therapy. Carotid artery disease: due forr a ultrasound, patient is now living [place], likes to hold off, thinking about establish with a new PCP there. Follow-up 02-2016 as scheduled.

## 2016-01-26 NOTE — Patient Instructions (Signed)
Use the Gel 4 times a day as needed

## 2016-01-26 NOTE — Assessment & Plan Note (Signed)
Hand tendinitis, trigger phenomena: Rx Options include observation versus continue topical therapy with NSAIDs versus referral. Elected topical therapy. Carotid artery disease: due forr a ultrasound, patient is now living [place], likes to hold off, thinking about establish with a new PCP there. Follow-up 02-2016 as scheduled.

## 2016-02-25 IMAGING — US US EXTREM LOW VENOUS*L*
1 series · 14 of 24 positions shown · non-contrast
Comparison: none

[Series 1: us extrem low venous*left* · 14 of 27 slices shown]
[im 1/27]
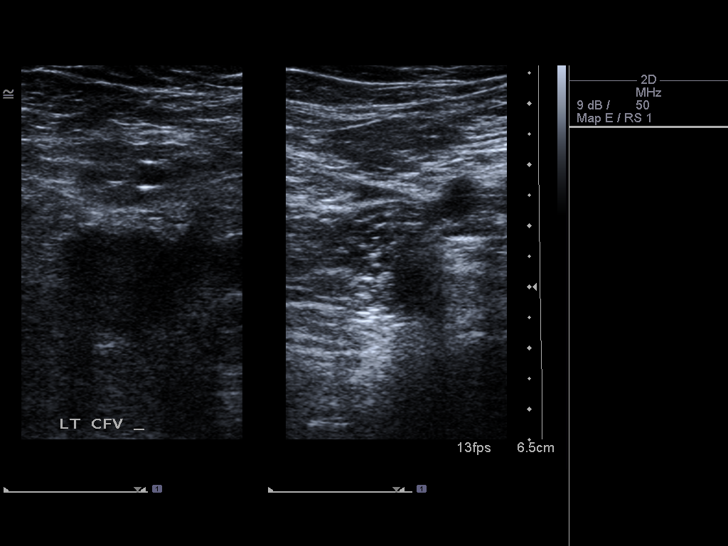
[im 3/27]
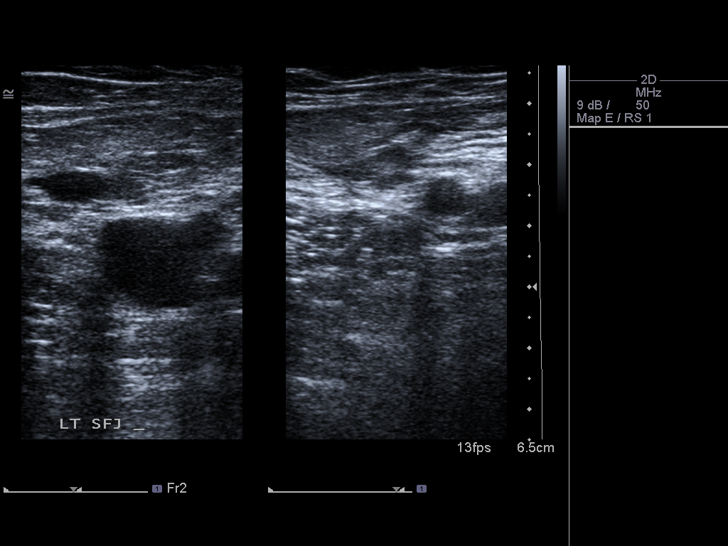
[im 5/27]
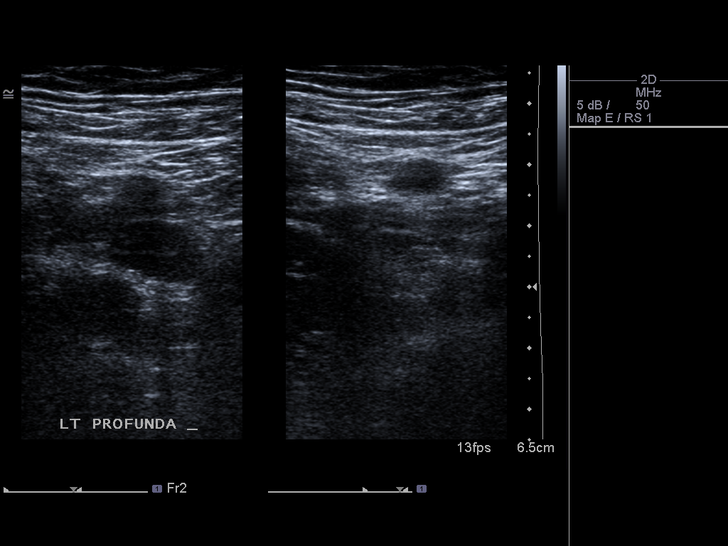
[im 7/27]
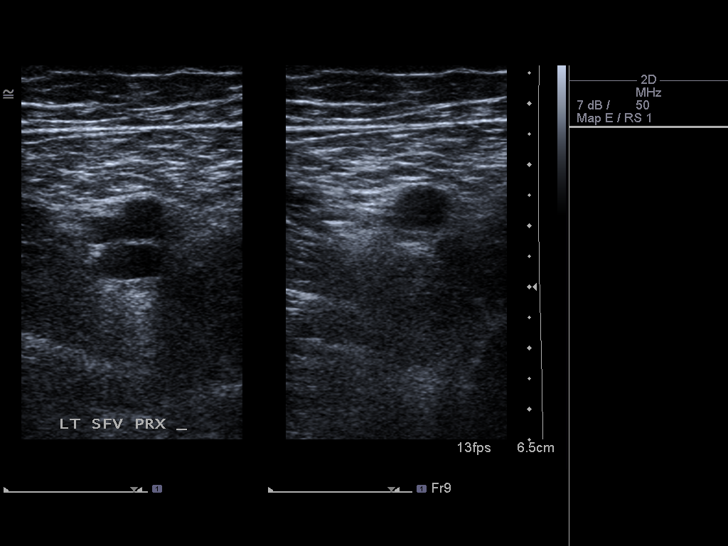
[im 8/27]
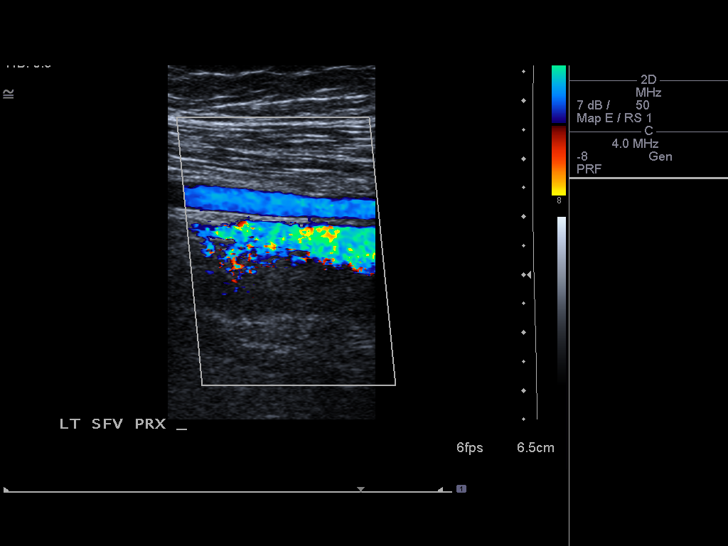
[im 11/27]
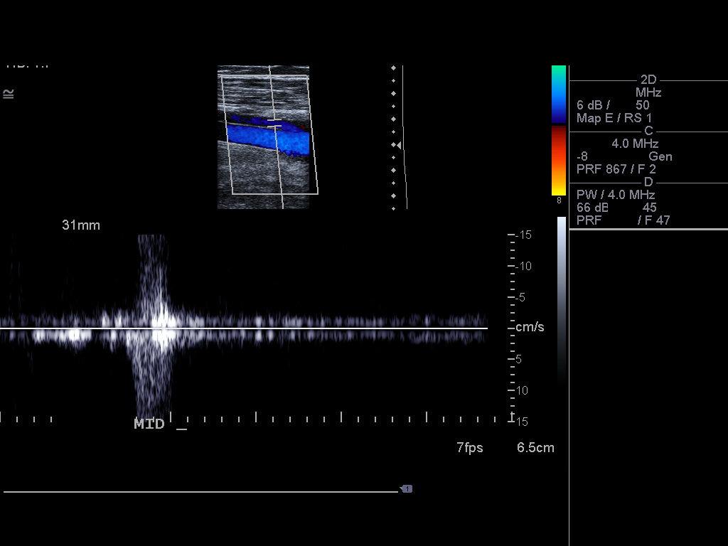
[im 13/27]
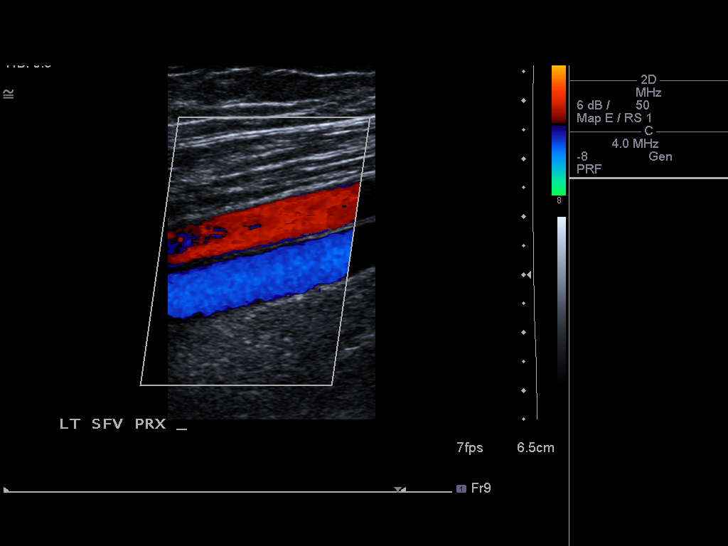
[im 14/27]
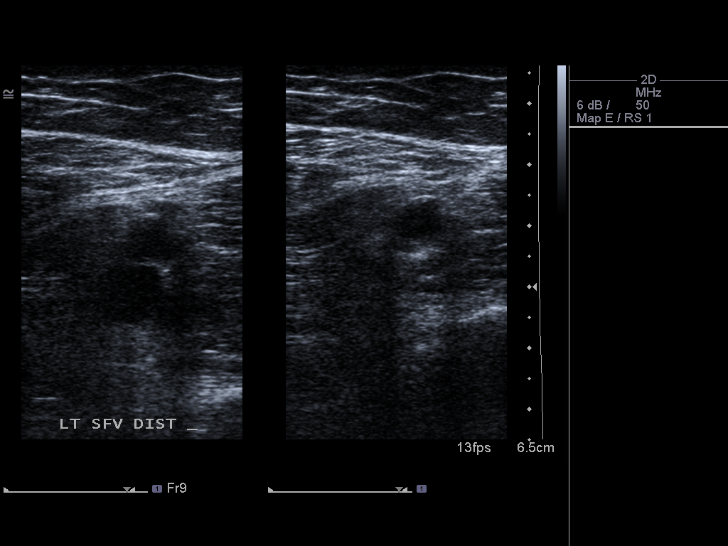
[im 16/27]
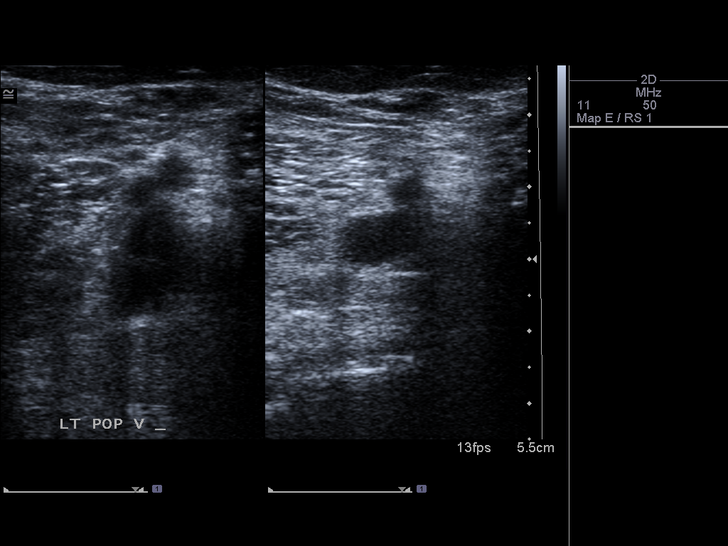
[im 19/27]
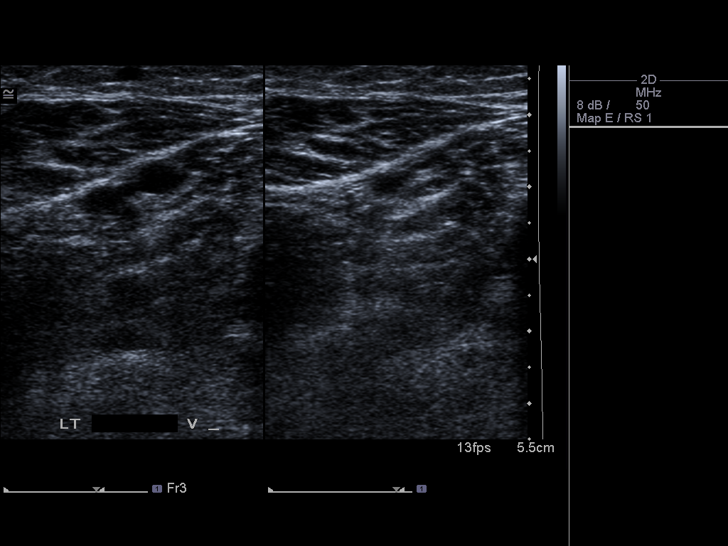
[im 21/27]
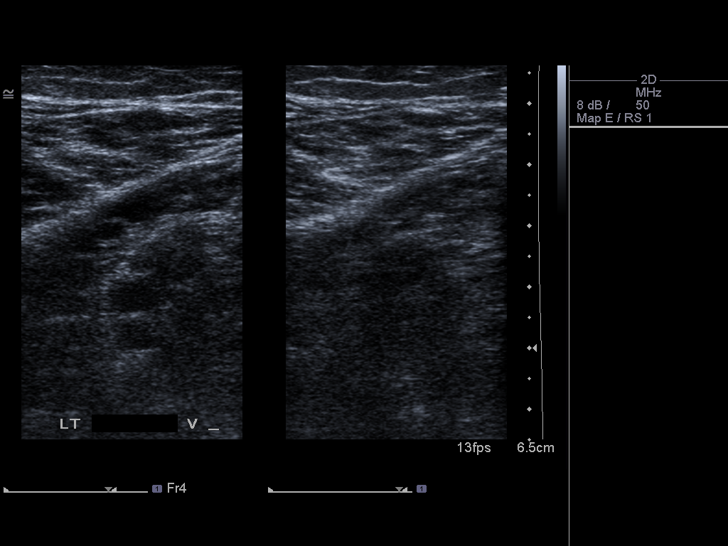
[im 22/27]
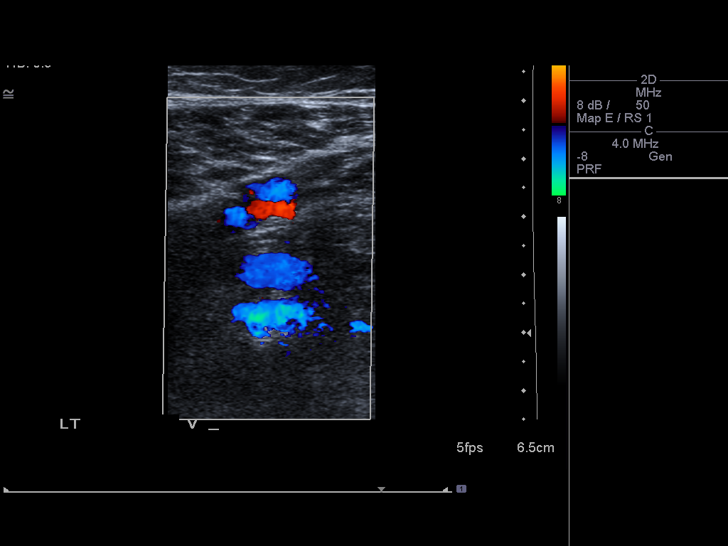
[im 24/27]
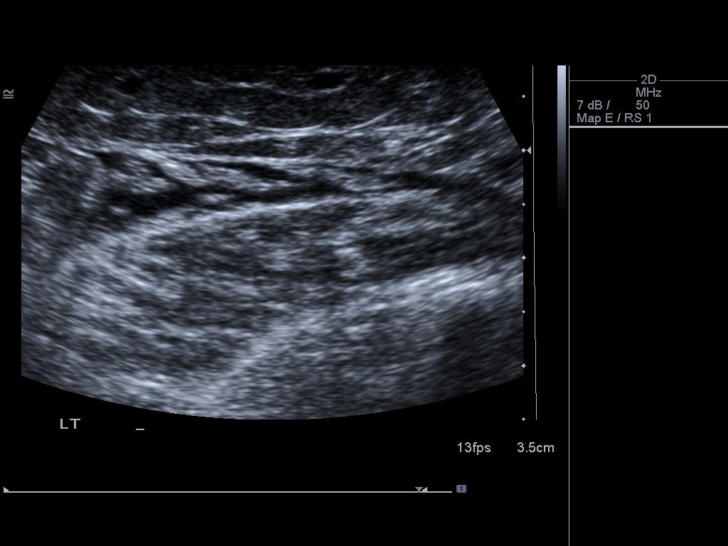
[im 27/27]
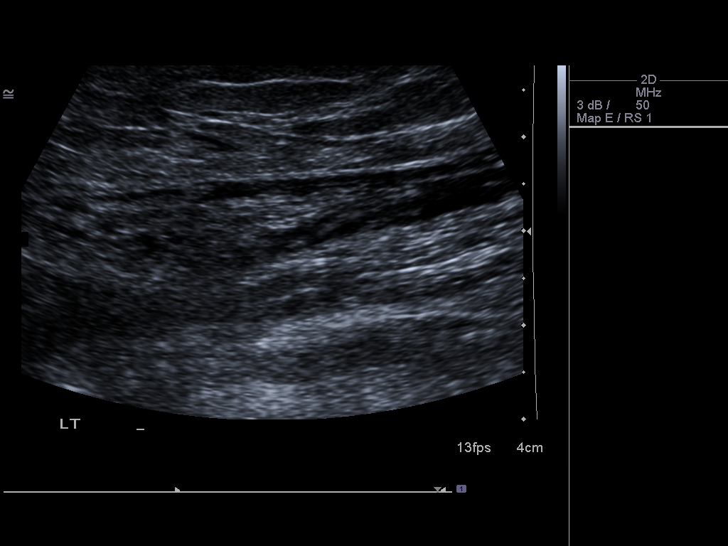

[14 of 24 positions shown; findings below may reference images not displayed]

CLINICAL DATA
Left knee pain and swelling following a long car trip, evaluate for
DVT

EXAM
LEFT LOWER EXTREMITY VENOUS DOPPLER ULTRASOUND

TECHNIQUE
Gray-scale sonography with graded compression, as well as color
Doppler and duplex ultrasound, were performed to evaluate the deep
venous system from the level of the common femoral vein through the
popliteal and proximal calf veins. Spectral Doppler was utilized to
evaluate flow at rest and with distal augmentation maneuvers.

COMPARISON
None.

FINDINGS
Thrombus within deep veins:  None visualized.

Compressibility of deep veins:  Normal.

Duplex waveform respiratory phasicity:  Normal.

Duplex waveform response to augmentation:  Normal.

Venous reflux:  None visualized.

Other findings: There is a minimal amount of subcutaneous edema
within the calf.

IMPRESSION
No evidence of DVT within the left lower extremity.

SIGNATURE

## 2016-03-01 ENCOUNTER — Ambulatory Visit: Payer: BLUE CROSS/BLUE SHIELD | Admitting: Internal Medicine

## 2016-03-01 ENCOUNTER — Encounter: Payer: Self-pay | Admitting: Internal Medicine

## 2016-03-05 ENCOUNTER — Ambulatory Visit (INDEPENDENT_AMBULATORY_CARE_PROVIDER_SITE_OTHER): Payer: BLUE CROSS/BLUE SHIELD | Admitting: Internal Medicine

## 2016-03-05 ENCOUNTER — Encounter: Payer: Self-pay | Admitting: Internal Medicine

## 2016-03-05 VITALS — BP 122/80 | HR 76 | Temp 97.7°F | Resp 14 | Ht 72.0 in | Wt 240.0 lb

## 2016-03-05 DIAGNOSIS — Z23 Encounter for immunization: Secondary | ICD-10-CM

## 2016-03-05 DIAGNOSIS — I10 Essential (primary) hypertension: Secondary | ICD-10-CM

## 2016-03-05 DIAGNOSIS — E119 Type 2 diabetes mellitus without complications: Secondary | ICD-10-CM | POA: Diagnosis not present

## 2016-03-05 LAB — BASIC METABOLIC PANEL
BUN: 16 mg/dL (ref 6–23)
CALCIUM: 9 mg/dL (ref 8.4–10.5)
CO2: 30 mEq/L (ref 19–32)
CREATININE: 1.03 mg/dL (ref 0.40–1.50)
Chloride: 106 mEq/L (ref 96–112)
GFR: 77.04 mL/min (ref >60.00)
Glucose, Bld: 152 mg/dL — ABNORMAL HIGH (ref 70–99)
Potassium: 4.1 mEq/L (ref 3.5–5.1)
Sodium: 140 mEq/L (ref 135–145)

## 2016-03-05 LAB — HEMOGLOBIN A1C: HEMOGLOBIN A1C: 5.9 % (ref 4.6–6.5)

## 2016-03-05 NOTE — Patient Instructions (Signed)
GO TO THE LAB : Get the blood work     GO TO THE FRONT DESK Schedule your next appointment for a  physical exam in 4 months. Fasting   Use OTC hydrocortisone cream 1% as needed and that you see your dermatologist

## 2016-03-05 NOTE — Progress Notes (Signed)
Subjective:    Patient ID: Edward Wilkerson, male    DOB: 1950-07-10, 65 y.o.   MRN: [id number]  DOS:  03/05/2016 Type of visit - description : rov Interval history: DM: doing great with exercise, very active. Has not been checking CBGs in the last month. HTN: Good med compliance , no recent ambulatory BPs. BP today is excellent Has a area of the skin that is very irritated for the last 2 months,  started after a mosquito bite.   Review of Systems Continue with mild paresthesias at the toe.  vision is normal, reports a negative eye exam June 2017 Denies chest pain or difficulty breathing No nausea, vomiting, diarrhea  Past Medical History:  Diagnosis Date  . Back pain    on-off even after surgery  . Depression   . DM type 2 (diabetes mellitus, type 2) ([place])    bordeline  . Hyperlipidemia   . Hypertension   . RBBB (right bundle branch block with left anterior fascicular block) 2009   neg stress ECHO, saw cards  . Sleep apnea    could'nt tol CPAP (has a machine)   . 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 APPENDECTOMY  1991  . [place]  . LUMBAR LAMINECTOMY  2001   Dr.Jenkins, still has mild pain  . SHOULDER SURGERY  07/2008   (Right)    Social History   Social History  . Marital status: Married    Spouse name: N/A  . Number of children: 2  . Years of education: N/A   Occupational History  . retired ---Sports administrator   Social History Main Topics  . Smoking status: Never Smoker  . Smokeless tobacco: Former Systems developer    Quit date: 09/21/1991  . Alcohol use 0.0 oz/week     Comment: beer socially  . Drug use: No  . Sexual activity: Not on file   Other Topics Concern  . Not on file   Social History Narrative   Retired 2016, more time to exercise         Medication List       Accurate as of 03/05/16 10:59 AM. Always use your most recent med list.          ACCU-CHEK AVIVA  CONNECT w/Device Kit 1 Device by Does not apply route once. Check blood sugar no more than two times daily.   accu-chek soft touch lancets Check blood sugar no more than twice daily   aspirin EC 81 MG tablet Take 81 mg by mouth daily.   atorvastatin 20 MG tablet Commonly known as:  LIPITOR Take 1 tablet (20 mg total) by mouth daily.   cyclobenzaprine 10 MG tablet Commonly known as:  FLEXERIL Take 1 tablet (10 mg total) by mouth 2 (two) times daily as needed for muscle spasms.   diclofenac sodium 1 % Gel Commonly known as:  VOLTAREN Apply 2 g topically 4 (four) times daily.   gabapentin 300 MG capsule Commonly known as:  NEURONTIN Take 1 capsule (300 mg total) by mouth at bedtime.   glucose blood test strip Commonly known as:  ACCU-CHEK AVIVA Check blood sugar no more than twice daily.   olmesartan-hydrochlorothiazide 40-25 MG tablet Commonly known as:  BENICAR HCT Take 1 tablet by mouth daily.   oxyCODONE-acetaminophen 5-325 MG tablet Commonly known as:  PERCOCET/ROXICET Take 1 tablet by mouth every 4 (  four) hours as needed. Reported on 10/27/2015          Objective:   Physical Exam  Skin:      BP 122/80 (BP Location: Left Arm, Patient Position: Sitting, Cuff Size: Normal)   Pulse 76   Temp 97.7 F (36.5 C) (Oral)   Resp 14   Ht 6' (1.829 m)   Wt 240 lb (108.9 kg)   SpO2 97%   BMI 32.55 kg/m  General:   Well developed, well nourished . NAD.  HEENT:  Normocephalic . Face symmetric, atraumatic Lungs:  CTA B Normal respiratory effort, no intercostal retractions, no accessory muscle use. Heart: RRR,  no murmur.  No pretibial edema bilaterally  DIABETIC FEET EXAM: No lower extremity edema Normal pedal pulses bilaterally Skin normal, nails normal, no calluses Pinprick examination of the feet normal. Neurologic:  alert & oriented X3.  Speech normal, gait appropriate for age and unassisted Psych--  Cognition and judgment appear intact.  Cooperative  with normal attention span and concentration.  Behavior appropriate. No anxious or depressed appearing.      Assessment & Plan:   Assessment   DM w/ neuropathy R big toe paresthesia, helped w/ gabapentin (started ~ 12-2014) HTN Hyperlipidemia Depression carotid artery dz: 11-2013  [place] 0- 39% B, next 11-2015  MSK:  ---Cervical spinal stenosis, back pain, sees Dr Nelva Bush  ---On percodet per Dr Nelva Bush  ---R big toe paresthesia  HEM: --Splenomegaly DX 2003 --Mild ITP OSA, CPAP intolerant RBBB 2009, (-)  stress echo, saw cardiology +FH CAD, father at age 30, F was a heavy smoker Moving to [place] 10-2015, will continue coming to this clinic for now.  PLAN  DM: Doing great with diet. Check A1c. Has mild paresthesia of the right big toe , pinprick examination normal. HTN: Continue Benicar, check a BMP Skin irritation: We'll see dermatology today. Hand tendinitis, trigger phenomena:  Doing well with topical treatment Primary care: Flu shot and Prevnar today. RTC 4 months

## 2016-03-05 NOTE — Progress Notes (Signed)
Pre visit review using our clinic review tool, if applicable. No additional management support is needed unless otherwise documented below in the visit note. 

## 2016-03-05 NOTE — Assessment & Plan Note (Signed)
DM: Doing great with diet. Check A1c. Has mild paresthesia of the right big toe , pinprick examination normal. HTN: Continue Benicar, check a BMP Skin irritation: We'll see dermatology today. Hand tendinitis, trigger phenomena:  Doing well with topical treatment Primary care: Flu shot and Prevnar today. RTC 4 months

## 2016-03-15 ENCOUNTER — Other Ambulatory Visit: Payer: Self-pay | Admitting: Internal Medicine

## 2016-03-30 ENCOUNTER — Telehealth: Payer: Self-pay | Admitting: Internal Medicine

## 2016-03-30 NOTE — Telephone Encounter (Signed)
Patient states that he has been using diclofenac sodium (VOLTAREN) 1 % GEL on both his left and right fingers as now his other finger is hurting as well. He would like to get a refill if possible. Please advise.   Pharmacy:  [facility] [place], [place]  Phone: [phone]

## 2016-03-31 MED ORDER — DICLOFENAC SODIUM 1 % TD GEL: 2.0000 g | Freq: Four times a day (QID) | TRANSDERMAL | 3 refills | Status: AC

## 2016-03-31 NOTE — Telephone Encounter (Signed)
Rx sent 

## 2016-03-31 NOTE — Telephone Encounter (Signed)
Okay to refill for 3 months 

## 2016-03-31 NOTE — Telephone Encounter (Signed)
Please advise 

## 2016-06-02 ENCOUNTER — Telehealth: Payer: Self-pay | Admitting: Internal Medicine

## 2016-06-02 MED ORDER — GABAPENTIN 300 MG PO CAPS: 300.0000 mg | ORAL_CAPSULE | Freq: Every day | ORAL | 1 refills | Status: AC

## 2016-06-02 NOTE — Telephone Encounter (Signed)
Relation to [id number] Call back number:[phone] Pharmacy: [facility]  Reason for call:   Patient requesting a refill gabapentin (NEURONTIN) 300 MG capsule [facility] is a new mail order

## 2016-06-02 NOTE — Telephone Encounter (Signed)
Confirmed with patient at the time of call [facility] is no longer he's pharmacy and confirmed mail order is called [facility], please follow up with patient directly.

## 2016-06-02 NOTE — Telephone Encounter (Signed)
Will need fax information please. [facility] not listed as pharmacy. Ask Pt if he needs to use [facility] instead?

## 2016-06-02 NOTE — Telephone Encounter (Signed)
Received [facility] pharmacy initiation notification. Gabapentin sent.

## 2016-06-16 NOTE — Telephone Encounter (Signed)
Insurance coverage letter regarding Diclofenac gel sent for scanning.

## 2016-07-01 ENCOUNTER — Ambulatory Visit (INDEPENDENT_AMBULATORY_CARE_PROVIDER_SITE_OTHER): Payer: Medicare Other | Admitting: Internal Medicine

## 2016-07-01 ENCOUNTER — Encounter: Payer: Self-pay | Admitting: Internal Medicine

## 2016-07-01 VITALS — BP 122/78 | HR 85 | Temp 97.5°F | Resp 14 | Ht 72.0 in | Wt 246.0 lb

## 2016-07-01 DIAGNOSIS — I10 Essential (primary) hypertension: Secondary | ICD-10-CM | POA: Diagnosis not present

## 2016-07-01 DIAGNOSIS — E785 Hyperlipidemia, unspecified: Secondary | ICD-10-CM | POA: Diagnosis not present

## 2016-07-01 DIAGNOSIS — E119 Type 2 diabetes mellitus without complications: Secondary | ICD-10-CM | POA: Diagnosis not present

## 2016-07-01 DIAGNOSIS — Z0000 Encounter for general adult medical examination without abnormal findings: Secondary | ICD-10-CM

## 2016-07-01 LAB — LIPID PANEL
CHOLESTEROL: 134 mg/dL (ref 0–200)
HDL: 46.8 mg/dL (ref >39.00)
LDL CALC: 65 mg/dL (ref 0–99)
NonHDL: 87.31
TRIGLYCERIDES: 110 mg/dL (ref 0.0–149.0)
Total CHOL/HDL Ratio: 3
VLDL: 22 mg/dL (ref 0.0–40.0)

## 2016-07-01 LAB — CBC WITH DIFFERENTIAL/PLATELET
BASOS ABS: 0 10*3/uL (ref 0.0–0.1)
BASOS PCT: 0.3 % (ref 0.0–3.0)
EOS ABS: 0.2 10*3/uL (ref 0.0–0.7)
Eosinophils Relative: 3.8 % (ref 0.0–5.0)
HCT: 42.2 % (ref 39.0–52.0)
Hemoglobin: 14.7 g/dL (ref 13.0–17.0)
LYMPHS ABS: 1.4 10*3/uL (ref 0.7–4.0)
Lymphocytes Relative: 29.3 % (ref 12.0–46.0)
MCHC: 34.9 g/dL (ref 30.0–36.0)
MCV: 89.5 fl (ref 78.0–100.0)
MONO ABS: 0.5 10*3/uL (ref 0.1–1.0)
Monocytes Relative: 11.3 % (ref 3.0–12.0)
NEUTROS ABS: 2.6 10*3/uL (ref 1.4–7.7)
NEUTROS PCT: 55.3 % (ref 43.0–77.0)
PLATELETS: 95 10*3/uL — AB (ref 150.0–400.0)
RBC: 4.72 Mil/uL (ref 4.22–5.81)
RDW: 13.8 % (ref 11.5–15.5)
WBC: 4.8 10*3/uL (ref 4.0–10.5)

## 2016-07-01 LAB — BASIC METABOLIC PANEL
BUN: 19 mg/dL (ref 6–23)
CALCIUM: 9.2 mg/dL (ref 8.4–10.5)
CO2: 26 meq/L (ref 19–32)
Chloride: 106 mEq/L (ref 96–112)
Creatinine, Ser: 0.96 mg/dL (ref 0.40–1.50)
GFR: 83.48 mL/min (ref >60.00)
GLUCOSE: 134 mg/dL — AB (ref 70–99)
POTASSIUM: 4.2 meq/L (ref 3.5–5.1)
SODIUM: 138 meq/L (ref 135–145)

## 2016-07-01 LAB — HEMOGLOBIN A1C: HEMOGLOBIN A1C: 6.5 % (ref 4.6–6.5)

## 2016-07-01 LAB — AST: AST: 23 U/L (ref 0–37)

## 2016-07-01 LAB — ALT: ALT: 32 U/L (ref 0–53)

## 2016-07-01 MED ORDER — ZOSTER VACCINE LIVE 19400 UNT/0.65ML ~~LOC~~ SUSR: 0.6500 mL | Freq: Once | SUBCUTANEOUS | 0 refills | Status: AC

## 2016-07-01 NOTE — Assessment & Plan Note (Addendum)
Td 2015 , prevnar 2017, pnm 23- 2008, had a flu shot zostavax Rx printed  +FH CAD-DM-stroke-colon polyps  S/p several Cscope  per pt  Last was  08-2013, see report, 5 years  Prostate cancer screening,   PSA, DRE wnl 2016 Labs:  BMP, AST, ALT, CBC, A1c, FLP counseled about diet-exercise

## 2016-07-01 NOTE — Patient Instructions (Signed)
GO TO THE LAB : Get the blood work    Please feel free to call or come back to the office any time

## 2016-07-01 NOTE — Progress Notes (Signed)
Pre visit review using our clinic review tool, if applicable. No additional management support is needed unless otherwise documented below in the visit note. 

## 2016-07-01 NOTE — Assessment & Plan Note (Signed)
DM: Diet control, has increased his physical activities since retirement, CBGs ranged from 130 to 200 depending on diet. Check A1c HTN: On Benicar-HCT, checking labs Hyperlipidemia: Continue Lipitor, checking labs MSK: Symptoms relatively well control, sees Dr. Valetta Close Percocet prn. Recent pain at the right hip could have  been trochanteric bursitis, seems to be decreasing. To get establish with a new PCP soon, pertinent information printed  for the patient RTC when necessary

## 2016-07-01 NOTE — Progress Notes (Signed)
Subjective:    Patient ID: Edward Wilkerson, male    DOB: 08-02-1950, 65 y.o.   MRN: [id number]  DOS:  07/01/2016 Type of visit - description : Complete physical exam Interval history: In general feeling well, he retired, is more active, trying to eat healthier    Review of Systems  Constitutional: No fever. No chills. No unexplained wt changes. No unusual sweats  HEENT: No dental problems, no ear discharge, no facial swelling, no voice changes. No eye discharge, no eye  redness , no  intolerance to light   Respiratory: No wheezing , no  difficulty breathing. No cough , no mucus production  Cardiovascular: No CP, no leg swelling , no  Palpitations  GI: no nausea, no vomiting, no diarrhea , no  abdominal pain.  No blood in the stools. No dysphagia, no odynophagia    Endocrine: No polyphagia, no polyuria , no polydipsia  GU: No dysuria, gross hematuria, difficulty urinating. No urinary urgency, no frequency.  Musculoskeletal:  Has a spinal stenosis, aches, pains and paresthesias @ baseline except for pain at the lateral aspect of the right hip for 4 weeks. No injury, worse when walking. Is only in the last 24 hours of the pain has decreased. No back pain per se..  Skin: No change in the color of the skin, palor , no  Rash  Allergic, immunologic: No environmental allergies , no  food allergies  Neurological: No dizziness no  syncope. No headaches. No diplopia, no slurred, no slurred speech, no motor deficits, no facial  Numbness  Hematological: No enlarged lymph nodes, no easy bruising , no unusual bleedings  Psychiatry: No suicidal ideas, no hallucinations, no beavior problems, no confusion.  No unusual/severe anxiety, no depression   Past Medical History:  Diagnosis Date  . Back pain    on-off even after surgery  . Depression   . DM type 2 (diabetes mellitus, type 2) ([place])    bordeline  . Hyperlipidemia   . Hypertension   . RBBB (right bundle branch block with left  anterior fascicular block) 2009   neg stress ECHO, saw cards  . Sleep apnea    could'nt tol CPAP (has a machine)   . 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 APPENDECTOMY  1991  . [place]  . LUMBAR LAMINECTOMY  2001   Dr.Jenkins, still has mild pain  . SHOULDER SURGERY  07/2008   (Right)    Social History   Social History  . Marital status: Married    Spouse name: N/A  . Number of children: 2  . Years of education: N/A   Occupational History  . retired ---Sports administrator   Social History Main Topics  . Smoking status: Never Smoker  . Smokeless tobacco: Former Systems developer    Quit date: 09/21/1991  . Alcohol use 0.0 oz/week     Comment: beer socially  . Drug use: No  . Sexual activity: Not on file   Other Topics Concern  . Not on file   Social History Narrative   Retired 2016, more time to exercise      Family History  Problem Relation Age of Onset  . Coronary artery disease Father   . Heart attack Father 36    deceased  . Coronary artery disease Brother   . Hypertension Mother   . Diabetes Mother   . Diabetes  Sister   . Stroke Sister     deceased   . Breast cancer Sister   . Cirrhosis Sister     no ETOH  . Colon polyps Other     many fam members   . Colon cancer Neg Hx   . Prostate cancer Neg Hx      Allergies as of 07/01/2016      Reactions   Sulfonamide Derivatives    rash      Medication List       Accurate as of 07/01/16  8:35 PM. Always use your most recent med list.          ACCU-CHEK AVIVA CONNECT w/Device Kit 1 Device by Does not apply route once. Check blood sugar no more than two times daily.   accu-chek soft touch lancets Check blood sugar no more than twice daily   aspirin EC 81 MG tablet Take 81 mg by mouth daily.   atorvastatin 20 MG tablet Commonly known as:  LIPITOR TAKE 1 TABLET BY MOUTH  DAILY   cyclobenzaprine 10 MG tablet Commonly known  as:  FLEXERIL Take 1 tablet (10 mg total) by mouth 2 (two) times daily as needed for muscle spasms.   diclofenac sodium 1 % Gel Commonly known as:  VOLTAREN Apply 2 g topically 4 (four) times daily.   gabapentin 300 MG capsule Commonly known as:  NEURONTIN Take 1 capsule (300 mg total) by mouth at bedtime.   glucose blood test strip Commonly known as:  ACCU-CHEK AVIVA Check blood sugar no more than twice daily.   olmesartan-hydrochlorothiazide 40-25 MG tablet Commonly known as:  BENICAR HCT TAKE 1 TABLET BY MOUTH  DAILY   oxyCODONE-acetaminophen 5-325 MG tablet Commonly known as:  PERCOCET/ROXICET Take 1 tablet by mouth every 4 (four) hours as needed. Reported on 10/27/2015   Zoster Vaccine Live (PF) 19400 UNT/0.65ML injection Commonly known as:  ZOSTAVAX Inject 19,400 Units into the skin once.          Objective:   Physical Exam BP 122/78 (BP Location: Left Arm, Patient Position: Sitting, Cuff Size: Normal)   Pulse 85   Temp 97.5 F (36.4 C) (Oral)   Resp 14   Ht 6' (1.829 m)   Wt 246 lb (111.6 kg)   SpO2 98%   BMI 33.36 kg/m   General:   Well developed, well nourished . NAD.  Neck: No  thyromegaly  HEENT:  Normocephalic . Face symmetric, atraumatic Lungs:  CTA B Normal respiratory effort, no intercostal retractions, no accessory muscle use. Heart: RRR,  no murmur.  No pretibial edema bilaterally  Abdomen:  Not distended, soft, non-tender. No rebound or rigidity.   MSK: Slightly TTP at the right trochanteric bursa, tenderness is minimal. Skin: Exposed areas without rash. Not pale. Not jaundice Neurologic:  alert & oriented X3.  Speech normal, gait appropriate for age and unassisted Strength symmetric and appropriate for age.  Psych: Cognition and judgment appear intact.  Cooperative with normal attention span and concentration.  Behavior appropriate. No anxious or depressed appearing.    Assessment & Plan:    Assessment   DM w/ neuropathy R big  toe paresthesia, helped w/ gabapentin (started ~ 12-2014) HTN Hyperlipidemia Depression carotid artery dz: 11-2013  [place] 0- 39% B, next 11-2015  MSK:  ---Cervical spinal stenosis, back pain, sees Dr Nelva Bush  ---On percodet per Dr Nelva Bush  ---R big toe paresthesia  HEM: --Splenomegaly DX 2003 --Mild ITP OSA, CPAP intolerant RBBB 2009, (-)  stress echo, saw cardiology +FH CAD, father at age 38, F was a heavy smoker Moving to [place] 10-2015, will continue coming to this clinic for now.  PLAN  DM: Diet control, has increased his physical activities since retirement, CBGs ranged from 130 to 200 depending on diet. Check A1c HTN: On Benicar-HCT, checking labs Hyperlipidemia: Continue Lipitor, checking labs MSK: Symptoms relatively well control, sees Dr. Valetta Close Percocet prn. Recent pain at the right hip could have  been trochanteric bursitis, seems to be decreasing. To get establish with a new PCP soon, pertinent information printed  for the patient RTC when necessary

## 2016-07-05 ENCOUNTER — Encounter: Payer: Self-pay | Admitting: Internal Medicine

## 2016-10-27 DIAGNOSIS — G894 Chronic pain syndrome: Secondary | ICD-10-CM | POA: Insufficient documentation

## 2016-12-06 ENCOUNTER — Telehealth: Payer: Self-pay | Admitting: Internal Medicine

## 2016-12-06 NOTE — Telephone Encounter (Signed)
Sent this to coding to review left patient a message to let him know

## 2016-12-06 NOTE — Telephone Encounter (Signed)
Pt called in he says that he had a CPE on 07/01/16, he said that the visit was mis coded. He says that his insurance will not cover. He would like to know if visit could be re-coded and re-submitted?   Please assist further.   CB: [id number]

## 2016-12-07 NOTE — Telephone Encounter (Signed)
Spoke to patient let him know I have resubmitted the claim to insurance and to disregard bill.

## 2016-12-21 NOTE — Telephone Encounter (Signed)
Patient states he received a call from [place] regarding bill mentioned below, patient would like to speak with you regarding why, please advise.

## 2016-12-21 NOTE — Telephone Encounter (Signed)
Called patient and let him know I did not see where claim had been resubmitted but I emailed charge corrections again. Patient requested I call him back after confirmation. I will let patient know once I get answer as to this being completed.

## 2016-12-23 NOTE — Telephone Encounter (Signed)
I left patient a voice message letting him know this claim has been resubmitted per e-mail confirmation
